# Patient Record
Sex: Male | Born: 1985 | Race: Black or African American | Hispanic: No | Marital: Single
Health system: Southern US, Community
[De-identification: ages and names within clinical notes are randomized; demographics above are authoritative.]

## PROBLEM LIST (undated history)

## (undated) DIAGNOSIS — I10 Essential (primary) hypertension: Secondary | ICD-10-CM

## (undated) HISTORY — DX: Essential (primary) hypertension: I10

---
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
## 2003-09-01 ENCOUNTER — Emergency Department (HOSPITAL_COMMUNITY): Admission: EM | Admit: 2003-09-01 | Discharge: 2003-09-01 | Payer: Self-pay | Admitting: Emergency Medicine

## 2004-05-17 ENCOUNTER — Emergency Department (HOSPITAL_COMMUNITY): Admission: EM | Admit: 2004-05-17 | Discharge: 2004-05-17 | Payer: Self-pay | Admitting: Family Medicine

## 2006-06-21 ENCOUNTER — Emergency Department (HOSPITAL_COMMUNITY): Admission: EM | Admit: 2006-06-21 | Discharge: 2006-06-21 | Payer: Self-pay | Admitting: Emergency Medicine

## 2008-11-26 ENCOUNTER — Emergency Department (HOSPITAL_COMMUNITY): Admission: EM | Admit: 2008-11-26 | Discharge: 2008-11-26 | Payer: Self-pay | Admitting: Emergency Medicine

## 2009-04-19 ENCOUNTER — Emergency Department (HOSPITAL_COMMUNITY): Admission: EM | Admit: 2009-04-19 | Discharge: 2009-04-19 | Payer: Self-pay | Admitting: Family Medicine

## 2011-02-15 ENCOUNTER — Emergency Department (HOSPITAL_COMMUNITY)
Admission: EM | Admit: 2011-02-15 | Discharge: 2011-02-15 | Disposition: A | Discharge status: Home / Self Care | Payer: Medicaid Other | Attending: Emergency Medicine | Admitting: Emergency Medicine

## 2011-02-15 ENCOUNTER — Encounter: Payer: Self-pay | Admitting: Emergency Medicine

## 2011-02-15 ENCOUNTER — Emergency Department (HOSPITAL_COMMUNITY): Payer: Medicaid Other

## 2011-02-15 DIAGNOSIS — M545 Low back pain, unspecified: Secondary | ICD-10-CM | POA: Insufficient documentation

## 2011-02-15 DIAGNOSIS — Z79899 Other long term (current) drug therapy: Secondary | ICD-10-CM | POA: Insufficient documentation

## 2011-02-15 DIAGNOSIS — F17200 Nicotine dependence, unspecified, uncomplicated: Secondary | ICD-10-CM | POA: Insufficient documentation

## 2011-02-15 DIAGNOSIS — S39012A Strain of muscle, fascia and tendon of lower back, initial encounter: Secondary | ICD-10-CM

## 2011-02-15 DIAGNOSIS — X58XXXA Exposure to other specified factors, initial encounter: Secondary | ICD-10-CM | POA: Insufficient documentation

## 2011-02-15 DIAGNOSIS — S335XXA Sprain of ligaments of lumbar spine, initial encounter: Secondary | ICD-10-CM | POA: Insufficient documentation

## 2011-02-15 MED ORDER — HYDROCODONE-ACETAMINOPHEN 5-500 MG PO TABS: 1.0000 | ORAL_TABLET | Freq: Four times a day (QID) | ORAL | Status: AC | PRN

## 2011-02-15 MED ORDER — IBUPROFEN 800 MG PO TABS: 800.0000 mg | ORAL_TABLET | Freq: Three times a day (TID) | ORAL | Status: AC

## 2011-02-15 MED ORDER — KETOROLAC TROMETHAMINE 60 MG/2ML IM SOLN
60.0000 mg | Freq: Once | INTRAMUSCULAR | Status: AC
  Administered 2011-02-15: 60 mg via INTRAMUSCULAR
  Filled 2011-02-15: qty 2

## 2011-02-15 MED ORDER — DIAZEPAM 5 MG PO TABS: 5.0000 mg | ORAL_TABLET | Freq: Three times a day (TID) | ORAL | Status: AC | PRN

## 2011-02-15 MED ORDER — DIAZEPAM 5 MG PO TABS
5.0000 mg | ORAL_TABLET | Freq: Once | ORAL | Status: AC
  Administered 2011-02-15: 5 mg via ORAL
  Filled 2011-02-15: qty 1

## 2011-02-15 MED ORDER — HYDROCODONE-ACETAMINOPHEN 5-325 MG PO TABS
1.0000 | ORAL_TABLET | Freq: Once | ORAL | Status: AC
  Administered 2011-02-15: 1 via ORAL
  Filled 2011-02-15: qty 1

## 2011-02-15 NOTE — ED Provider Notes (Signed)
Medical screening examination/treatment/procedure(s) were performed by non-physician practitioner and as supervising physician I was immediately available for consultation/collaboration.   Jasmine Awe, MD 02/15/11 2355

## 2011-02-15 NOTE — ED Notes (Signed)
Pt c/o pain in lower back after lifting a refrigerator yesterday

## 2011-02-15 NOTE — ED Notes (Signed)
Complaining of lower back pain. States re-injured back moving refrigerator. Pain located in lower mid back with radiation. Requesting xray.
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
## 2012-01-13 ENCOUNTER — Emergency Department (HOSPITAL_COMMUNITY)
Admission: EM | Admit: 2012-01-13 | Discharge: 2012-01-14 | Disposition: A | Discharge status: Home / Self Care | Payer: Self-pay | Attending: Emergency Medicine | Admitting: Emergency Medicine

## 2012-01-13 ENCOUNTER — Encounter (HOSPITAL_COMMUNITY): Payer: Self-pay

## 2012-01-13 ENCOUNTER — Emergency Department (HOSPITAL_COMMUNITY): Payer: Self-pay

## 2012-01-13 DIAGNOSIS — K6289 Other specified diseases of anus and rectum: Secondary | ICD-10-CM | POA: Insufficient documentation

## 2012-01-13 LAB — POCT I-STAT, CHEM 8
BUN: 10 mg/dL (ref 6–23)
Calcium, Ion: 1.17 mmol/L (ref 1.12–1.23)
Chloride: 104 meq/L (ref 96–112)
Creatinine, Ser: 1.2 mg/dL (ref 0.50–1.35)
Glucose, Bld: 90 mg/dL (ref 70–99)
HCT: 45 % (ref 39.0–52.0)
Hemoglobin: 15.3 g/dL (ref 13.0–17.0)
Potassium: 4.1 meq/L (ref 3.5–5.1)
Sodium: 141 meq/L (ref 135–145)
TCO2: 24 mmol/L (ref 0–100)

## 2012-01-13 MED ORDER — HYDROMORPHONE HCL PF 1 MG/ML IJ SOLN
1.0000 mg | Freq: Once | INTRAMUSCULAR | Status: AC
  Administered 2012-01-13: 1 mg via INTRAVENOUS
  Filled 2012-01-13: qty 1

## 2012-01-13 MED ORDER — IOHEXOL 300 MG/ML  SOLN
20.0000 mL | INTRAMUSCULAR | Status: AC
  Administered 2012-01-13: 20 mL via ORAL

## 2012-01-13 MED ORDER — MORPHINE SULFATE 4 MG/ML IJ SOLN
4.0000 mg | Freq: Once | INTRAMUSCULAR | Status: AC
  Administered 2012-01-13: 4 mg via INTRAVENOUS
  Filled 2012-01-13: qty 1

## 2012-01-13 NOTE — ED Provider Notes (Signed)
History     CSN: [id number]  Arrival date & time 01/13/12  2029   None     Chief Complaint  Patient presents with  . Hemorrhoids    (Consider location/radiation/quality/duration/timing/severity/associated sxs/prior treatment) HPI History provided by pt.   Pt presents w/ gradually worsening rectal pain x 4 days.  Believes he has a hemorrhoid.  Pain aggravated by sitting and he won't have a BM because too painful.  Has noticed some pus in his underwear and blood when he wipes his rectum.  No associated fever or GU sx.    History reviewed. No pertinent past medical history.  History reviewed. No pertinent past surgical history.  History reviewed. No pertinent family history.  History  Substance Use Topics  . Smoking status: Current Every Day Smoker    Types: Cigarettes  . Smokeless tobacco: Not on file  . Alcohol Use: No      Review of Systems  All other systems reviewed and are negative.    Allergies  Review of patient's allergies indicates no known allergies.  Home Medications   Current Outpatient Rx  Name Route Sig Dispense Refill  . ACETAMINOPHEN 500 MG PO TABS Oral Take 1,000 mg by mouth every 6 (six) hours as needed. For pain      BP 139/79  Pulse 62  Temp 98.3 F (36.8 C) (Oral)  Resp 18  SpO2 98%  Physical Exam  Nursing note and vitals reviewed. Constitutional: He is oriented to person, place, and time. He appears well-developed and well-nourished. No distress.  HENT:  Head: Normocephalic and atraumatic.  Eyes:       Normal appearance  Neck: Normal range of motion.  Cardiovascular: Normal rate and regular rhythm.   Pulmonary/Chest: Effort normal and breath sounds normal. No respiratory distress.  Abdominal: Soft. Bowel sounds are normal. He exhibits no distension and no mass. There is no tenderness. There is no rebound and no guarding.  Genitourinary:       No external hemorrhoids or abscess.  No skin changes.  Nml rectal tone.  Nml stool  color.  Severe tenderness left wall of rectum w/out obvious mass or fistula.  Pt became diaphoretic and tearful during exam.   Musculoskeletal: Normal range of motion.  Neurological: He is alert and oriented to person, place, and time.  Skin: Skin is warm and dry. No rash noted.  Psychiatric: He has a normal mood and affect. His behavior is normal.    ED Course  Procedures (including critical care time)  Labs Reviewed - No data to display No results found.   1. Rectal pain       MDM  26yo M presents w/ rectal pain x 4 days.  Has noticed pus in underwear and blood when he wipes.  Has not attempted to have a BM.  No visible/palpable findings on exam, but left wall of rectum severely tender and pt became both tearful and diaphoretic w/ palpation.  Discussed w/ Dr. Ranae Palms who recommends CT pelvis to r/o abscess.  Pt receiving IV analgesics.  11:25 PM   CT shows left perirectal fullness.  Discussed w/ Dr. Lavella Lemons and she recommends starting clindamycin for possible infectious process and refer to [facility].  Prescribed percocet for pain and recommended docusate sodium as well.  Return precautions discussed.         Otilio Miu, [place] 01/14/12 [phone]

## 2012-01-13 NOTE — ED Notes (Signed)
Patient currently resting quietly in bed; no respiratory or acute distress noted.  Patient updated on plan of care; patient currently pending for CT scan.  CT aware that patient has finished contrast/water cups.  Patient denies any other needs at this time; will continue to monitor.

## 2012-01-13 NOTE — ED Notes (Signed)
Pt finished drinking CT contrast, CT notified.. 

## 2012-01-13 NOTE — ED Notes (Signed)
Pt reports rectal pain x1 week d/t hemorrhoid. Pt denies hx of the same. Pt reports taking OTC Tylenol w/no relief

## 2012-01-14 MED ORDER — OXYCODONE-ACETAMINOPHEN 5-325 MG PO TABS: 1.0000 | ORAL_TABLET | ORAL | Status: DC | PRN

## 2012-01-14 MED ORDER — IOHEXOL 300 MG/ML  SOLN
100.0000 mL | Freq: Once | INTRAMUSCULAR | Status: AC | PRN
  Administered 2012-01-14: 100 mL via INTRAVENOUS

## 2012-01-14 MED ORDER — OXYCODONE-ACETAMINOPHEN 5-325 MG PO TABS
2.0000 | ORAL_TABLET | Freq: Once | ORAL | Status: AC
  Administered 2012-01-14: 2 via ORAL
  Filled 2012-01-14: qty 2

## 2012-01-14 MED ORDER — CLINDAMYCIN HCL 300 MG PO CAPS: 150.0000 mg | ORAL_CAPSULE | Freq: Three times a day (TID) | ORAL | Status: DC

## 2012-01-14 NOTE — ED Notes (Signed)
I gave the patient a cup of ice and a sprite. 

## 2012-01-14 NOTE — ED Notes (Signed)
Patient currently resting quietly in bed; no respiratory or acute distress noted.  Patient updated on plan of care; informed patient that we are currently waiting for PA to look over CT results.  Patient denies any needs at this time; will continue to monitor.

## 2012-01-14 NOTE — ED Notes (Signed)
Patient given copy of discharge paperwork; went over discharge instructions with patient.  Instructed patient to take prescriptions as directed, to not drive or drink while taking narcotic, to finish entire antibiotic prescription completely, and to return to the ED for follow up with general surgery center for new, worsening, or concerning symptoms.

## 2012-01-14 NOTE — ED Provider Notes (Signed)
Medical screening examination/treatment/procedure(s) were performed by non-physician practitioner and as supervising physician I was immediately available for consultation/collaboration.   Tanae Petrosky, MD 01/14/12 2337 

## 2012-01-16 ENCOUNTER — Ambulatory Visit (INDEPENDENT_AMBULATORY_CARE_PROVIDER_SITE_OTHER): Payer: Self-pay | Admitting: Surgery

## 2012-01-16 ENCOUNTER — Encounter (INDEPENDENT_AMBULATORY_CARE_PROVIDER_SITE_OTHER): Payer: Self-pay | Admitting: Surgery

## 2012-01-16 VITALS — BP 126/76 | HR 55 | Temp 98.0°F | Resp 18 | Ht >= 80 in | Wt 160.6 lb

## 2012-01-16 DIAGNOSIS — K6289 Other specified diseases of anus and rectum: Secondary | ICD-10-CM

## 2012-01-16 MED ORDER — AMOXICILLIN-POT CLAVULANATE 875-125 MG PO TABS: 1.0000 | ORAL_TABLET | Freq: Two times a day (BID) | ORAL | Status: AC

## 2012-01-16 MED ORDER — OXYCODONE-ACETAMINOPHEN 5-325 MG PO TABS: 1.0000 | ORAL_TABLET | ORAL | Status: DC | PRN

## 2012-01-16 NOTE — Progress Notes (Signed)
Patient ID: Cesar Williams, male   DOB: 14-Nov-1985, 26 y.o.   MRN: [id number]  Chief Complaint  Patient presents with  . New Evaluation    ?peri anal abscess    HPI Cesar Williams is a 26 y.o. male.  Patient sent at request of Dr.Yelverton do 2 history of perianal and rectal pain for 2 days. He was seen in the emergency room where CT scan showed some vague thickening of the left perirectal tissue without abscess. No fever or chills documented. Pain is sharp in nature. Denies any swelling or drainage from the rectum. No history of trauma. Pain is severe and sharp in nature. HPI  No past medical history on file.  No past surgical history on file.  No family history on file.  Social History History  Substance Use Topics  . Smoking status: Current Every Day Smoker    Types: Cigarettes  . Smokeless tobacco: Not on file  . Alcohol Use: No    No Known Allergies  Current Outpatient Prescriptions  Medication Sig Dispense Refill  . acetaminophen (TYLENOL) 500 MG tablet Take 1,000 mg by mouth every 6 (six) hours as needed. For pain      . oxyCODONE-acetaminophen (PERCOCET/ROXICET) 5-325 MG per tablet Take 1 tablet by mouth every 4 (four) hours as needed for pain.  20 tablet  0  . amoxicillin-clavulanate (AUGMENTIN) 875-125 MG per tablet Take 1 tablet by mouth 2 (two) times daily.  20 tablet  0    Review of Systems Review of Systems  Constitutional: Negative.   HENT: Negative.   Eyes: Negative.   Respiratory: Negative.   Cardiovascular: Negative.   Gastrointestinal: Positive for rectal pain.  Genitourinary: Negative.   Neurological: Negative.   Hematological: Negative.     Blood pressure 126/76, pulse 55, temperature 98 F (36.7 C), temperature source Oral, resp. rate 18, height 6\' 11"  (2.108 m), weight 160 lb 9.6 oz (72.848 kg).  Physical Exam Physical Exam  Constitutional: He is oriented to person, place, and time. He appears well-developed and well-nourished.  HENT:  Head:  Normocephalic and atraumatic.  Eyes: EOM are normal. Pupils are equal, round, and reactive to light.  Abdominal: Soft. Bowel sounds are normal.  Genitourinary: Rectal exam shows tenderness. Rectal exam shows no external hemorrhoid, no mass and anal tone normal.     Musculoskeletal: Normal range of motion.  Neurological: He is alert and oriented to person, place, and time.  Skin: Skin is warm and dry.  Psychiatric: He has a normal mood and affect. His behavior is normal. Judgment and thought content normal.    Data Reviewed  *RADIOLOGY REPORT*  Clinical Data: Rectal pain  CT ABDOMEN AND PELVIS WITH CONTRAST  Technique: Multidetector CT imaging of the abdomen and pelvis was  performed following the standard protocol during bolus  administration of intravenous contrast.  Contrast: OMNIPAQUE IOHEXOL 300 MG/ML SOLN  Comparison: None.  Findings: Lung bases are clear.  Liver, spleen, pancreas, and adrenal glands are within normal  limits.  Gallbladder is unremarkable. No intrahepatic or extrahepatic  ductal dilatation.  Kidneys are within normal limits. No hydronephrosis.  No evidence of bowel obstruction. Normal appendix. Possible mild  left perirectal fullness (series 2/image 81), equivocal.  No drainable perirectal fluid collection/abscess.  No evidence of abdominal aortic aneurysm.  No abdominopelvic ascites.  No suspicious abdominopelvic lymphadenopathy.  Prostate is unremarkable.  Bladder is underdistended.  Visualized osseous structures are within normal limits.  IMPRESSION:  Possible mild left perirectal fullness,  equivocal. Correlate with  physical exam.  Assessment   Proctitis    Plan    Stop  Clindamycin.  Start augmentin.  No abscess on exam of rectum.  Add diltiazem gel for pain control.  Soak in tub.  Return 1 week.  Pain meds as needed.       Malani Lees A. 01/16/2012, 4:12 PM

## 2012-01-16 NOTE — Patient Instructions (Signed)
Proctitis Proctitis is the swelling and soreness (inflammation) of the lining of the rectum. The rectum is at the end of the large intestine and is attached to the anus. The inflammation causes pain and discomfort. It may be short-term (acute) or long-lasting (chronic). CAUSES Inflammation in the rectum can be caused by many things, such as:  Sexually transmitted diseases (STDs).  Infection.  Anal-rectal trauma or injury.  Ulcerative colitis or Crohn's disease.  Radiation therapy directed near the rectum.  Antibiotic therapy. SYMPTOMS  Sudden, uncomfortable, and frequent urge to have a bowel movement.  Anal or rectal pain.  Abdominal cramping or pain.  Sensation that the rectum is full.  Rectal bleeding.  Pus or mucus discharge from anus.  Diarrhea or frequent soft, loose stools. DIAGNOSIS Diagnosis may include the following:  A history and physical exam.  An STD test.  Blood tests.  Stool tests.  Rectal culture.  A procedure to evaluate the anal canal (anoscopy).  Procedures to look at part, or the entire large bowel (sigmoidoscopy, colonoscopy). TREATMENT Treatment of proctitis depends on the cause. Reducing the symptoms of inflammation and eliminating infection are the main goals of treatment. Treatment may include:  Home remedies and lifestyle, such as sitz baths and avoiding food right before bedtime.  Topical ointments, foams, suppositories, or enemas, such as corticosteroids or anti-inflammatories.  Antibiotic or antiviral medicines to treat infection or to control harmful bacteria.  Medicines to control diarrhea, soften stools, and reduce pain.  Medicines to suppress the immune system.  Avoiding the activity that caused rectal trauma.  Nutritional, dietary, or herbal supplements.  Heat or laser therapy for persistent bleeding.  A dilation procedure to enlarge a narrowed rectum.  Surgery, though rare, may be necessary to repair damaged rectal  lining. HOME CARE INSTRUCTIONS Only take medicines that are recommended or approved by your caregiver.Do not take anti-diarrhea medicine without your caregiver's approval. SEEK MEDICAL CARE IF:  You often experience one or more of the symptoms noted above.  You keep experiencing symptoms after treatment.  You have questions or concerns about your symptoms or treatment plan. MAKE SURE YOU:  Understand these instructions.  Will watch your condition.  Will get help right away if you are not doing well or get worse. FOR MORE INFORMATION National Institute of Diabetes and Digestive and Kidney Disease (NIDDK): www.digestive.https://bradley.com/ Document Released: 03/02/2011 Document Revised: 06/05/2011 Document Reviewed: 03/02/2011 [facility] Patient Information 2013 [place], [place].

## 2012-01-23 ENCOUNTER — Encounter (INDEPENDENT_AMBULATORY_CARE_PROVIDER_SITE_OTHER): Payer: Self-pay | Admitting: Surgery

## 2012-02-05 ENCOUNTER — Encounter (INDEPENDENT_AMBULATORY_CARE_PROVIDER_SITE_OTHER): Payer: Self-pay | Admitting: Surgery

## 2013-11-09 ENCOUNTER — Encounter (HOSPITAL_COMMUNITY): Payer: Self-pay | Admitting: Emergency Medicine

## 2013-11-09 DIAGNOSIS — R0789 Other chest pain: Secondary | ICD-10-CM | POA: Insufficient documentation

## 2013-11-09 DIAGNOSIS — F17200 Nicotine dependence, unspecified, uncomplicated: Secondary | ICD-10-CM | POA: Insufficient documentation

## 2013-11-09 DIAGNOSIS — I10 Essential (primary) hypertension: Secondary | ICD-10-CM | POA: Insufficient documentation

## 2013-11-09 DIAGNOSIS — Y9389 Activity, other specified: Secondary | ICD-10-CM | POA: Insufficient documentation

## 2013-11-09 DIAGNOSIS — T6391XA Toxic effect of contact with unspecified venomous animal, accidental (unintentional), initial encounter: Secondary | ICD-10-CM | POA: Insufficient documentation

## 2013-11-09 DIAGNOSIS — T63461A Toxic effect of venom of wasps, accidental (unintentional), initial encounter: Secondary | ICD-10-CM | POA: Insufficient documentation

## 2013-11-09 DIAGNOSIS — Y9289 Other specified places as the place of occurrence of the external cause: Secondary | ICD-10-CM | POA: Insufficient documentation

## 2013-11-09 MED ORDER — OXYCODONE-ACETAMINOPHEN 5-325 MG PO TABS
1.0000 | ORAL_TABLET | Freq: Once | ORAL | Status: AC
  Administered 2013-11-09: 1 via ORAL
  Filled 2013-11-09: qty 1

## 2013-11-09 NOTE — ED Notes (Signed)
Patient here with complaint of right arm swelling starting yesterday around 1600 after bee sting. Additionally states that his throat feels "scratchy", his breathing feels "heavy", and his face feels swollen. Swelling currently extends from hand to mid forearm. Site marked in triage.

## 2013-11-10 ENCOUNTER — Emergency Department (HOSPITAL_COMMUNITY)
Admission: EM | Admit: 2013-11-10 | Discharge: 2013-11-10 | Disposition: A | Discharge status: Home / Self Care | Payer: Self-pay | Attending: Emergency Medicine | Admitting: Emergency Medicine

## 2013-11-10 ENCOUNTER — Encounter (HOSPITAL_COMMUNITY): Payer: Self-pay | Admitting: Emergency Medicine

## 2013-11-10 DIAGNOSIS — T63444A Toxic effect of venom of bees, undetermined, initial encounter: Secondary | ICD-10-CM

## 2013-11-10 HISTORY — DX: Essential (primary) hypertension: I10

## 2013-11-10 MED ORDER — PREDNISONE 50 MG PO TABS: 50.0000 mg | ORAL_TABLET | Freq: Every day | ORAL | Status: DC

## 2013-11-10 MED ORDER — TRAMADOL HCL 50 MG PO TABS: 50.0000 mg | ORAL_TABLET | Freq: Four times a day (QID) | ORAL | Status: DC | PRN

## 2013-11-10 MED ORDER — CEPHALEXIN 500 MG PO CAPS: 500.0000 mg | ORAL_CAPSULE | Freq: Four times a day (QID) | ORAL | Status: DC

## 2013-11-10 MED ORDER — DIPHENHYDRAMINE HCL 25 MG PO CAPS
50.0000 mg | ORAL_CAPSULE | Freq: Once | ORAL | Status: AC
  Administered 2013-11-10: 50 mg via ORAL
  Filled 2013-11-10: qty 2

## 2013-11-10 MED ORDER — DIPHENHYDRAMINE HCL 25 MG PO CAPS: 25.0000 mg | ORAL_CAPSULE | Freq: Four times a day (QID) | ORAL | Status: DC | PRN

## 2013-11-10 MED ORDER — DEXAMETHASONE SODIUM PHOSPHATE 10 MG/ML IJ SOLN
10.0000 mg | Freq: Once | INTRAMUSCULAR | Status: AC
  Administered 2013-11-10: 10 mg via INTRAMUSCULAR
  Filled 2013-11-10: qty 1

## 2013-11-10 NOTE — ED Provider Notes (Signed)
CSN: [id number]     Arrival date & time 11/09/13  2238 History   First MD Initiated Contact with Patient 11/10/13 0113     Chief Complaint  Patient presents with  . Insect Bite  . Cellulitis     (Consider location/radiation/quality/duration/timing/severity/associated sxs/prior Treatment) HPI Comments: PT comes in with cc of rash to the RUE. Pt is right handed. States that he was out fishing, and got bit around 4 pm. Bit by wasp vs. Bee. Pt has immediate swelling, but ocertime, the swelling and redness has increased. No n/v/f/c. Pt had no wheezing, but felt tightness in his chest when he first came. No hx of allergies.  The history is provided by the patient.    Past Medical History  Diagnosis Date  . Hypertension    History reviewed. No pertinent past surgical history. History reviewed. No pertinent family history. History  Substance Use Topics  . Smoking status: Current Every Day Smoker    Types: Cigarettes  . Smokeless tobacco: Not on file  . Alcohol Use: No    Review of Systems  Constitutional: Negative for activity change and appetite change.  Respiratory: Positive for chest tightness. Negative for shortness of breath and wheezing.   Cardiovascular: Negative for chest pain.  Gastrointestinal: Negative for abdominal pain.  Genitourinary: Negative for dysuria.  Skin: Positive for rash.      Allergies  Review of patient's allergies indicates no known allergies.  Home Medications   Prior to Admission medications   Medication Sig Start Date End Date Taking? Authorizing Provider  diphenhydrAMINE (BENADRYL) 25 MG tablet Take 25 mg by mouth every 6 (six) hours as needed for itching.   Yes Historical Provider, MD  cephALEXin (KEFLEX) 500 MG capsule Take 1 capsule (500 mg total) by mouth 4 (four) times daily. 11/10/13   Derwood KaplanAnkit Donyetta Ogletree, MD  diphenhydrAMINE (BENADRYL) 25 mg capsule Take 1 capsule (25 mg total) by mouth every 6 (six) hours as needed for itching. 11/10/13    Derwood KaplanAnkit Najir Roop, MD  predniSONE (DELTASONE) 50 MG tablet Take 1 tablet (50 mg total) by mouth daily. 11/10/13   Derwood KaplanAnkit Ceazia Harb, MD  traMADol (ULTRAM) 50 MG tablet Take 1 tablet (50 mg total) by mouth every 6 (six) hours as needed. 11/10/13   Rashod Gougeon Rhunette CroftNanavati, MD   BP 94/64  Pulse 53  Temp(Src) 97.9 F (36.6 C) (Oral)  Resp 16  Ht 6\' 1"  (1.854 m)  Wt 150 lb (68.04 kg)  BMI 19.79 kg/m2  SpO2 100% Physical Exam  Nursing note and vitals reviewed. Constitutional: He is oriented to person, place, and time. He appears well-developed.  HENT:  Head: Normocephalic and atraumatic.  Eyes: Conjunctivae and EOM are normal. Pupils are equal, round, and reactive to light.  Neck: Normal range of motion. Neck supple.  Cardiovascular: Normal rate and regular rhythm.   Pulmonary/Chest: Effort normal and breath sounds normal.  Abdominal: Soft. Bowel sounds are normal. He exhibits no distension. There is no tenderness. There is no rebound and no guarding.  Musculoskeletal:  Right upper extremity, especially the hand is swollen ,with some erythema that is spreading to the forearm. No crepitus. Passive ROM is intact.  Neurological: He is alert and oriented to person, place, and time.  Skin: Skin is warm. Rash noted.    ED Course  Procedures (including critical care time) Labs Review Labs Reviewed - No data to display  Imaging Review No results found.   EKG Interpretation None      MDM   Final  diagnoses:  Allergic reaction to bee sting, undetermined intent, initial encounter    Pt with localized allergic reaction to the RUE from a bee/wasp bite.  No concerns for nec fascitis, vitals are stable, pt immunocompetent and there is 0 SIRS. Will d.c. Lung exam is clear- no need for epi pen.    Derwood Kaplan, MD 11/10/13 [phone]

## 2013-11-10 NOTE — ED Notes (Signed)
Patient discharged with all personal belongings. 

## 2013-11-10 NOTE — Discharge Instructions (Signed)

## 2013-11-10 NOTE — ED Notes (Signed)
Patient states he was stung by apprx 3-4 bees yesterday and his hand and arm are increasing in swelling. Edema marked at this time.

## 2014-10-16 ENCOUNTER — Emergency Department (HOSPITAL_COMMUNITY)
Admission: EM | Admit: 2014-10-16 | Discharge: 2014-10-16 | Disposition: A | Discharge status: Home / Self Care | Payer: Self-pay | Attending: Emergency Medicine | Admitting: Emergency Medicine

## 2014-10-16 ENCOUNTER — Emergency Department (HOSPITAL_COMMUNITY): Payer: Self-pay

## 2014-10-16 ENCOUNTER — Encounter (HOSPITAL_COMMUNITY): Payer: Self-pay | Admitting: Neurology

## 2014-10-16 DIAGNOSIS — R519 Headache, unspecified: Secondary | ICD-10-CM

## 2014-10-16 DIAGNOSIS — R51 Headache: Secondary | ICD-10-CM

## 2014-10-16 DIAGNOSIS — S3992XA Unspecified injury of lower back, initial encounter: Secondary | ICD-10-CM | POA: Insufficient documentation

## 2014-10-16 DIAGNOSIS — S060X9A Concussion with loss of consciousness of unspecified duration, initial encounter: Secondary | ICD-10-CM | POA: Insufficient documentation

## 2014-10-16 DIAGNOSIS — S29001A Unspecified injury of muscle and tendon of front wall of thorax, initial encounter: Secondary | ICD-10-CM | POA: Insufficient documentation

## 2014-10-16 DIAGNOSIS — Z792 Long term (current) use of antibiotics: Secondary | ICD-10-CM | POA: Insufficient documentation

## 2014-10-16 DIAGNOSIS — S0990XA Unspecified injury of head, initial encounter: Secondary | ICD-10-CM | POA: Insufficient documentation

## 2014-10-16 DIAGNOSIS — I10 Essential (primary) hypertension: Secondary | ICD-10-CM | POA: Insufficient documentation

## 2014-10-16 DIAGNOSIS — S199XXA Unspecified injury of neck, initial encounter: Secondary | ICD-10-CM | POA: Insufficient documentation

## 2014-10-16 DIAGNOSIS — Y998 Other external cause status: Secondary | ICD-10-CM | POA: Insufficient documentation

## 2014-10-16 DIAGNOSIS — Y9289 Other specified places as the place of occurrence of the external cause: Secondary | ICD-10-CM | POA: Insufficient documentation

## 2014-10-16 DIAGNOSIS — R0789 Other chest pain: Secondary | ICD-10-CM

## 2014-10-16 DIAGNOSIS — M542 Cervicalgia: Secondary | ICD-10-CM

## 2014-10-16 DIAGNOSIS — Y9389 Activity, other specified: Secondary | ICD-10-CM | POA: Insufficient documentation

## 2014-10-16 DIAGNOSIS — T148XXA Other injury of unspecified body region, initial encounter: Secondary | ICD-10-CM

## 2014-10-16 DIAGNOSIS — Z720 Tobacco use: Secondary | ICD-10-CM | POA: Insufficient documentation

## 2014-10-16 MED ORDER — HYDROCODONE-ACETAMINOPHEN 5-325 MG PO TABS
1.0000 | ORAL_TABLET | Freq: Once | ORAL | Status: AC
  Administered 2014-10-16: 1 via ORAL
  Filled 2014-10-16: qty 1

## 2014-10-16 MED ORDER — CYCLOBENZAPRINE HCL 10 MG PO TABS: 10.0000 mg | ORAL_TABLET | Freq: Three times a day (TID) | ORAL | Status: DC | PRN

## 2014-10-16 MED ORDER — IBUPROFEN 800 MG PO TABS: 800.0000 mg | ORAL_TABLET | Freq: Three times a day (TID) | ORAL | Status: DC | PRN
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
## 2014-10-16 NOTE — Discharge Instructions (Signed)
Read the information below.  Use the prescribed medication as directed.  Please discuss all new medications with your pharmacist.  You may return to the Emergency Department at any time for worsening condition or any new symptoms that concern you.  If you develop worsening chest pain, shortness of breath, fever, you pass out, or become weak or dizzy, return to the ER for a recheck.     You have had a head injury which does not appear to require admission at this time. A concussion is a state of changed mental ability from trauma. SEEK IMMEDIATE MEDICAL ATTENTION IF: There is confusion or drowsiness (although children frequently become drowsy after injury).  You cannot awaken the injured person.  There is nausea (feeling sick to your stomach) or continued, forceful vomiting.  You notice dizziness or unsteadiness which is getting worse, or inability to walk.  You have convulsions or unconsciousness.  You experience severe, persistent headaches not relieved by Tylenol?. (Do not take aspirin as this impairs clotting abilities). Take other pain medications only as directed.  You cannot use arms or legs normally.  There are changes in pupil sizes. (This is the black center in the colored part of the eye)  There is clear or bloody discharge from the nose or ears.  Change in speech, vision, swallowing, or understanding.  Localized weakness, numbness, tingling, or change in bowel or bladder control.   Abrasions An abrasion is a cut or scrape of the skin. Abrasions do not go through all layers of the skin. HOME CARE  If a bandage (dressing) was put on your wound, change it as told by your doctor. If the bandage sticks, soak it off with warm.  Wash the area with water and soap 2 times a day. Rinse off the soap. Pat the area dry with a clean towel.  Put on medicated cream (ointment) as told by your doctor.  Change your bandage right away if it gets wet or dirty.  Only take medicine as told by your  doctor.  See your doctor within 24-48 hours to get your wound checked.  Check your wound for redness, puffiness (swelling), or yellowish-white fluid (pus). GET HELP RIGHT AWAY IF:   You have more pain in the wound.  You have redness, swelling, or tenderness around the wound.  You have pus coming from the wound.  You have a fever or lasting symptoms for more than 2-3 days.  You have a fever and your symptoms suddenly get worse.  You have a bad smell coming from the wound or bandage. MAKE SURE YOU:   Understand these instructions.  Will watch your condition.  Will get help right away if you are not doing well or get worse. Document Released: 08/30/2007 Document Revised: 12/06/2011 Document Reviewed: 02/14/2011 [facility] Patient Information 2015 [place], [place]. This information is not intended to replace advice given to you by your health care provider. Make sure you discuss any questions you have with your health care provider.  Chest Wall Pain Chest wall pain is pain felt in or around the chest bones and muscles. It may take up to 6 weeks to get better. It may take longer if you are active. Chest wall pain can happen on its own. Other times, things like germs, injury, coughing, or exercise can cause the pain. HOME CARE   Avoid activities that make you tired or cause pain. Try not to use your chest, belly (abdominal), or side muscles. Do not use heavy weights.  Put  ice on the sore area.  Put ice in a plastic bag.  Place a towel between your skin and the bag.  Leave the ice on for 15-20 minutes for the first 2 days.  Only take medicine as told by your doctor. GET HELP RIGHT AWAY IF:   You have more pain or are very uncomfortable.  You have a fever.  Your chest pain gets worse.  You have new problems.  You feel sick to your stomach (nauseous) or throw up (vomit).  You start to sweat or feel lightheaded.  You have a cough with mucus (phlegm).  You cough up  blood. MAKE SURE YOU:   Understand these instructions.  Will watch your condition.  Will get help right away if you are not doing well or get worse. Document Released: 08/30/2007 Document Revised: 06/05/2011 Document Reviewed: 11/07/2010 [facility] Patient Information 2015 [place], [place]. This information is not intended to replace advice given to you by your health care provider. Make sure you discuss any questions you have with your health care provider.  Concussion A concussion is a brain injury. It is caused by:  A hit to the head.  A quick and sudden movement (jolt) of the head or neck. A concussion is usually not life threatening. Even so, it can cause serious problems. If you had a concussion before, you may have concussion-like problems after a hit to your head. HOME CARE General Instructions  Follow your doctor's directions carefully.  Take medicines only as told by your doctor.  Only take medicines your doctor says are safe.  Do not drink alcohol until your doctor says it is okay. Alcohol and some drugs can slow down healing. They can also put you at risk for further injury.  If you are having trouble remembering things, write them down.  Try to do one thing at a time if you get distracted easily. For example, do not watch TV while making dinner.  Talk to your family members or close friends when making important decisions.  Follow up with your doctor as told.  Watch your symptoms. Tell others to do the same. Serious problems can sometimes happen after a concussion. Older adults are more likely to have these problems.  Tell your teachers, school nurse, school counselor, coach, Event organiser, or work Production designer, theatre/television/film about your concussion. Tell them about what you can or cannot do. They should watch to see if:  It gets even harder for you to pay attention or concentrate.  It gets even harder for you to remember things or learn new things.  You need more time than normal  to finish things.  You become annoyed (irritable) more than before.  You are not able to deal with stress as well.  You have more problems than before.  Rest. Make sure you:  Get plenty of sleep at night.  Go to sleep early.  Go to bed at the same time every day. Try to wake up at the same time.  Rest during the day.  Take naps when you feel tired.  Limit activities where you have to think a lot or concentrate. These include:  Doing homework.  Doing work related to a job.  Watching TV.  Using the computer. Returning To Your Regular Activities Return to your normal activities slowly, not all at once. You must give your body and brain enough time to heal.   Do not play sports or do other athletic activities until your doctor says it is okay.  Ask your doctor when you can drive, ride a bicycle, or work other vehicles or machines. Never do these things if you feel dizzy.  Ask your doctor about when you can return to work or school. Preventing Another Concussion It is very important to avoid another brain injury, especially before you have healed. In rare cases, another injury can lead to permanent brain damage, brain swelling, or death. The risk of this is greatest during the first 7-10 days after your injury. Avoid injuries by:   Wearing a seat belt when riding in a car.  Not drinking too much alcohol.  Avoiding activities that could lead to a second concussion (such as contact sports).  Wearing a helmet when doing activities like:  Biking.  Skiing.  Skateboarding.  Skating.  Making your home safer by:  Removing things from the floor or stairways that could make you trip.  Using grab bars in bathrooms and handrails by stairs.  Placing non-slip mats on floors and in bathtubs.  Improve lighting in dark areas. GET HELP IF:  It gets even harder for you to pay attention or concentrate.  It gets even harder for you to remember things or learn new  things.  You need more time than normal to finish things.  You become annoyed (irritable) more than before.  You are not able to deal with stress as well.  You have more problems than before.  You have problems keeping your balance.  You are not able to react quickly when you should. Get help if you have any of these problems for more than 2 weeks:   Lasting (chronic) headaches.  Dizziness or trouble balancing.  Feeling sick to your stomach (nausea).  Seeing (vision) problems.  Being affected by noises or light more than normal.  Feeling sad, low, down in the dumps, blue, gloomy, or empty (depressed).  Mood changes (mood swings).  Feeling of fear or nervousness about what may happen (anxiety).  Feeling annoyed.  Memory problems.  Problems concentrating or paying attention.  Sleep problems.  Feeling tired all the time. GET HELP RIGHT AWAY IF:   You have bad headaches or your headaches get worse.  You have weakness (even if it is in one hand, leg, or part of the face).  You have loss of feeling (numbness).  You feel off balance.  You keep throwing up (vomiting).  You feel tired.  One black center of your eye (pupil) is larger than the other.  You twitch or shake violently (convulse).  Your speech is not clear (slurred).  You are more confused, easily angered (agitated), or annoyed than before.  You have more trouble resting than before.  You are unable to recognize people or places.  You have neck pain.  It is difficult to wake you up.  You have unusual behavior changes.  You pass out (lose consciousness). MAKE SURE YOU:   Understand these instructions.  Will watch your condition.  Will get help right away if you are not doing well or get worse. Document Released: 03/01/2009 Document Revised: 07/28/2013 Document Reviewed: 10/03/2012 [facility] Patient Information 2015 [place], [place]. This information is not intended to replace advice given  to you by your health care provider. Make sure you discuss any questions you have with your health care provider.  Head Injury You have a head injury. Headaches and throwing up (vomiting) are common after a head injury. It should be easy to wake up from sleeping. Sometimes you must stay in the hospital.  Most problems happen within the first 24 hours. Side effects may occur up to 7-10 days after the injury.  WHAT ARE THE TYPES OF HEAD INJURIES? Head injuries can be as minor as a bump. Some head injuries can be more severe. More severe head injuries include:  A jarring injury to the brain (concussion).  A bruise of the brain (contusion). This mean there is bleeding in the brain that can cause swelling.  A cracked skull (skull fracture).  Bleeding in the brain that collects, clots, and forms a bump (hematoma). WHEN SHOULD I GET HELP RIGHT AWAY?   You are confused or sleepy.  You cannot be woken up.  You feel sick to your stomach (nauseous) or keep throwing up (vomiting).  Your dizziness or unsteadiness is getting worse.  You have very bad, lasting headaches that are not helped by medicine. Take medicines only as told by your doctor.  You cannot use your arms or legs like normal.  You cannot walk.  You notice changes in the black spots in the center of the colored part of your eye (pupil).  You have clear or bloody fluid coming from your nose or ears.  You have trouble seeing. During the next 24 hours after the injury, you must stay with someone who can watch you. This person should get help right away (call 911 in the [place] if you start to shake and are not able to control it (have seizures), you pass out, or you are unable to wake up. HOW CAN I PREVENT A HEAD INJURY IN THE FUTURE?  Wear seat belts.  Wear a helmet while bike riding and playing sports like football.  Stay away from dangerous activities around the house. WHEN CAN I RETURN TO NORMAL ACTIVITIES AND ATHLETICS? See  your doctor before doing these activities. You should not do normal activities or play contact sports until 1 week after the following symptoms have stopped:  Headache that does not go away.  Dizziness.  Poor attention.  Confusion.  Memory problems.  Sickness to your stomach or throwing up.  Tiredness.  Fussiness.  Bothered by bright lights or loud noises.  Anxiousness or depression.  Restless sleep. MAKE SURE YOU:   Understand these instructions.  Will watch your condition.  Will get help right away if you are not doing well or get worse. Document Released: 02/24/2008 Document Revised: 07/28/2013 Document Reviewed: 11/18/2012 [facility] Patient Information 2015 [place], [place]. This information is not intended to replace advice given to you by your health care provider. Make sure you discuss any questions you have with your health care provider.    Emergency Department Resource Guide 1) Find a Doctor and Pay Out of Pocket Although you won't have to find out who is covered by your insurance plan, it is a good idea to ask around and get recommendations. You will then need to call the office and see if the doctor you have chosen will accept you as a new patient and what types of options they offer for patients who are self-pay. Some doctors offer discounts or will set up payment plans for their patients who do not have insurance, but you will need to ask so you aren't surprised when you get to your appointment.  2) Contact Your Local Health Department Not all health departments have doctors that can see patients for sick visits, but many do, so it is worth a call to see if yours does. If you don't know where your local health department  is, you can check in your phone book. The CDC also has a tool to help you locate your state's health department, and many state websites also have listings of all of their local health departments.  3) Find a Walk-in Clinic If your illness is  not likely to be very severe or complicated, you may want to try a walk in clinic. These are popping up all over the country in pharmacies, drugstores, and shopping centers. They're usually staffed by nurse practitioners or physician assistants that have been trained to treat common illnesses and complaints. They're usually fairly quick and inexpensive. However, if you have serious medical issues or chronic medical problems, these are probably not your best option.  No Primary Care Doctor: - Call Health Connect at  [phone] - they can help you locate a primary care doctor that  accepts your insurance, provides certain services, etc. - Physician Referral Service- [phone]  Chronic Pain Problems: Organization         Address  Phone   Notes  [facility]  [phone] Patients need to be referred by their primary care doctor.   Medication Assistance: Organization         Address  Phone   Notes  [facility] Medication [facility] [address] [place] [place], [place] [address] [phone] --Must be a resident of [facility] -- Must have NO insurance coverage whatsoever (no Medicaid/ Medicare, etc.) -- The pt. MUST have a primary care doctor that directs their care regularly and follows them in the community   MedAssist  [phone]   [employer]  [phone]    Agencies that provide inexpensive medical care: Organization         Address  Phone   Notes  [facility]  [phone]   [facility]    [phone]   [facility] [address] [place], [place] [address] [phone]   [facility] [address], [place] [phone]   Planned Parenthood    [phone]   [facility]    [phone]   [facility] and [facility]  [address], [place] Phone:  [phone], Fax:  [phone] Hours of Operation:  9 am - 6  pm, M-F.  Also accepts Medicaid/Medicare and self-pay.  [facility]  [address], [place] Phone: [phone], Fax: [phone]. Hours of Operation:  8:30 am - 5:30 pm, M-F.  Also accepts Medicaid and self-pay.  [facility] [place] [address] [place] Phone: [phone]   [facility] [address] Natasha Bence [place], [place] [phone], Ext. [phone] Mondays & Thursdays: 7-9 AM.  First 15 patients are seen on a first come, first serve basis.    Medicaid-accepting [facility] Providers:  Organization         Address  Phone   Notes  [facility] [address], [place] [phone] Also accepts self-pay patients.  [facility] [address] Laurell Josephs [place], [place]  [phone]   [facility] [address], [place] [phone]   [facility] [address] [place] [phone]   Renaye Rakers [address], [place]   [phone] Only accepts [place] patients after they have their  name applied to their card.   Self-Pay (no insurance) in [facility]:  Organization         Address  Phone   Notes  Sickle Cell Patients, [facility] [address] [place], [place] [phone]   [facility] [address] [place], [place] [phone]   [facility] [place]  [address], [place] [phone]   [facility]/Dr. Osei-Bonsu  [address] [place] or [address], [place] [phone] Phone number for both [place] and [place] locations is the same.  [facility] [address], [place] [phone]   [facility] [address] [place] or [address] [phone]   [facility] [address] [place] [phone], phone; [phone], fax Sees patients 1st and 3rd Saturday of every month.  Must not qualify for public or private insurance (i.e. Medicaid, Medicare, [place] Health Choice, Veterans' Benefits)  Household income should be no more than 200% of the poverty level The clinic cannot treat you if you are pregnant or think you are pregnant  Sexually transmitted diseases are not treated at the clinic.    Dental Care: Organization         Address  Phone  Notes  [facility] Department of [facility] [address] [place], [place] [phone] Accepts children up to age 64 who are enrolled in [place] or [place] Health Choice; pregnant women with a Medicaid card; and children who have applied for Medicaid or [place] Health Choice, but were declined, whose parents can pay a reduced fee at time of service.  [facility]  [address], [place] [phone] Accepts children up to age 30 who are enrolled in [place] or [place] Health Choice; pregnant women with a Medicaid card; and children who have applied for Medicaid or [place] Health Choice, but were declined, whose parents can pay a reduced fee at time of service.  [facility]  [address] [place], [place] [phone] Patients are seen by appointment only. Walk-ins are not accepted. [facility] will see patients 37 years of age and older. Monday - Tuesday (8am-5pm) Most Wednesdays (8:30-5pm) $30 per visit, cash only  [facility] Adult [facility]  [address], [facility] [phone] Patients are seen by appointment only. Walk-ins are not accepted. [facility] will see patients 106 years of age and older. One Wednesday Evening (Monthly: Volunteer Based).  $30 per visit, cash only  [employer]  [phone] for adults; Children under age 65, call Graduate Pediatric Dentistry at [phone]. Children aged 32-14, please call [phone] to request a pediatric application.  Dental services are provided in all areas of dental care including fillings, crowns and bridges, complete and partial dentures, implants, gum treatment, root canals, and extractions. Preventive care is also provided. Treatment is provided to both adults and children. Patients are selected via a lottery and there is often a waiting list.   [facility] [address] [place]  [phone] [email or web address]   [facility] [address] [place], [place] [phone], Ext. [phone] Second and Fourth Thursday of each month, opens at 6:30 AM; Clinic ends at 9 AM.  Patients are seen on a  first-come first-served basis, and a limited number are seen during each clinic.   [facility]  [address] Ether Griffins [place], [place] [phone]   Eligibility Requirements You must have lived in [place], [place], or [place] counties for at least the last three months.   You cannot be eligible for state or federal sponsored [place], including [employer], [place], or [employer].   You generally cannot be eligible for healthcare insurance through your employer.    How to apply: Eligibility screenings are held every Tuesday and Wednesday afternoon from 1:00 pm until 4:00 pm. You do not need an appointment for the interview!  [employer] [address], [place], [place] [phone]   [facility] Health Department  [phone]   [facility] Health Department  [phone]   [facility] Health Department  [phone]    Behavioral Health Resources in the Community: Intensive Outpatient Programs Organization         Address  Phone  Notes  [facility] [address] [place], [place] [phone]   [facility] [address], [place], [place] [phone]   [facility]  [address], [place], [place]  [phone]   [facility] [address]  [place], [place] [phone] or [phone]   Substance Abuse Resources Organization         Address  Phone  Notes  [facility]  [phone]   [facility]  [phone]   [facility] [place]  [phone]   Floydene Flock  [phone]   Residential & Outpatient Substance Abuse Program  [phone]   Psychological Services Organization         Address  Phone  Notes  [facility]  [phone]   [facility]  [phone]   [facility] [address] [place] [phone] or [phone]    Mobile Crisis Teams Organization         Address  Phone  Notes  [facility], Mobile Crisis Care Unit  [phone]   [facility]  [address] [place], [place] [phone]   Doristine Locks [address] [place] [phone]    Self-Help/Support Groups Organization         Address  Phone             Notes  [facility] [place] - variety of support groups  [phone]- [phone] Call for more information  Narcotics Anonymous (NA), [facility] [address], [employer] [place]  2 meetings at this location   Statistician         Address  Phone  Notes  [facility] [address],    [place]  [phone]   [facility]  [address] [place] [address], [place], [place] [phone]   [facility] [address] [place], [place] [phone] Admissions: 8am-3pm M-F  [facility] [address],    [place], [place] [phone]   [facility] [address] [place] [address] Starling Manns [place], [place] [phone]   [facility] [address]  [place], [place] [phone]   [facility] - Intensive Outpatient [address] Laurell Josephs [address], [place], [place]  [phone]   [facility] ([facility] [address] [place] [address] [place]  [place], [place] [phone] or [phone]   [facility] [address], [place], [place] [phone]  Accepts Medicaid  [facility] [address],  [place] [phone] Substance Abuse/Addiction Treatment   [employer]         Address  Phone  Notes  [facility]  [phone]   Angie Fava, PhD [address] Kn[address] [place], [place]   [phone] or [phone]   [facility]   [address] [place], [place] [phone]   [facility] [address], [place], [place] [phone] Insurance/Medicaid/sponsorship through [facility] [address]                                    [place], [place] [phone] Therapy/tele-psych/case  [facility] [address], [place] [phone]    Dr. Lolly Mustache  [phone]   [facility]  [employer] [facility] Dept[facility] 1) [address], [place] 2) [address] [place] 3)  [address], [place] [phone]  [phone]   [facility] Child Abuse Hotline [phone] or [phone] (After Hours)

## 2014-10-20 ENCOUNTER — Emergency Department (HOSPITAL_COMMUNITY)
Admission: EM | Admit: 2014-10-20 | Discharge: 2014-10-20 | Disposition: A | Discharge status: Home / Self Care | Payer: Self-pay | Attending: Emergency Medicine | Admitting: Emergency Medicine

## 2014-10-20 ENCOUNTER — Encounter (HOSPITAL_COMMUNITY): Payer: Self-pay | Admitting: Emergency Medicine

## 2014-10-20 DIAGNOSIS — H53149 Visual discomfort, unspecified: Secondary | ICD-10-CM | POA: Insufficient documentation

## 2014-10-20 DIAGNOSIS — Z79899 Other long term (current) drug therapy: Secondary | ICD-10-CM | POA: Insufficient documentation

## 2014-10-20 DIAGNOSIS — M25511 Pain in right shoulder: Secondary | ICD-10-CM | POA: Insufficient documentation

## 2014-10-20 DIAGNOSIS — F0781 Postconcussional syndrome: Secondary | ICD-10-CM | POA: Insufficient documentation

## 2014-10-20 DIAGNOSIS — R112 Nausea with vomiting, unspecified: Secondary | ICD-10-CM | POA: Insufficient documentation

## 2014-10-20 DIAGNOSIS — R531 Weakness: Secondary | ICD-10-CM | POA: Insufficient documentation

## 2014-10-20 DIAGNOSIS — I10 Essential (primary) hypertension: Secondary | ICD-10-CM | POA: Insufficient documentation

## 2014-10-20 DIAGNOSIS — Z720 Tobacco use: Secondary | ICD-10-CM | POA: Insufficient documentation

## 2014-10-20 DIAGNOSIS — G44309 Post-traumatic headache, unspecified, not intractable: Secondary | ICD-10-CM | POA: Insufficient documentation

## 2014-10-20 DIAGNOSIS — M545 Low back pain: Secondary | ICD-10-CM | POA: Insufficient documentation

## 2014-10-20 MED ORDER — PROMETHAZINE HCL 25 MG PO TABS: 25.0000 mg | ORAL_TABLET | Freq: Four times a day (QID) | ORAL | Status: DC | PRN

## 2014-10-20 MED ORDER — ACETAMINOPHEN 325 MG PO TABS
650.0000 mg | ORAL_TABLET | Freq: Once | ORAL | Status: AC
  Administered 2014-10-20: 650 mg via ORAL
  Filled 2014-10-20: qty 2

## 2014-10-20 NOTE — Discharge Instructions (Signed)
Post-Concussion Syndrome Post-concussion syndrome describes the symptoms that can occur after a head injury. These symptoms can last from weeks to months. CAUSES  It is not clear why some head injuries cause post-concussion syndrome. It can occur whether your head injury was mild or severe and whether you were wearing head protection or not.  SIGNS AND SYMPTOMS  Memory difficulties.  Dizziness.  Headaches.  Double vision or blurry vision.  Sensitivity to light.  Hearing difficulties.  Depression.  Tiredness.  Weakness.  Difficulty with concentration.  Difficulty sleeping or staying asleep.  Vomiting.  Poor balance or instability on your feet.  Slow reaction time.  Difficulty learning and remembering things you have heard. DIAGNOSIS  There is no test to determine whether you have post-concussion syndrome. Your health care provider may order an imaging scan of your brain, such as a CT scan, to check for other problems that may be causing your symptoms (such as severe injury inside your skull). TREATMENT  Usually, these problems disappear over time without medical care. Your health care provider may prescribe medicine to help ease your symptoms. It is important to follow up with a neurologist to evaluate your recovery and address any lingering symptoms or issues. HOME CARE INSTRUCTIONS   Only take over-the-counter or prescription medicines for pain, discomfort, or fever as directed by your health care provider. Do not take aspirin. Aspirin can slow blood clotting.  Sleep with your head slightly elevated to help with headaches.  Avoid any situation where there is potential for another head injury (football, hockey, soccer, basketball, martial arts, downhill snow sports, and horseback riding). Your condition will get worse every time you experience a concussion. You should avoid these activities until you are evaluated by the appropriate follow-up health care  providers.  Keep all follow-up appointments as directed by your health care provider. SEEK IMMEDIATE MEDICAL CARE IF:  You develop confusion or unusual drowsiness.  You cannot wake the injured person.  You develop nausea or persistent, forceful vomiting.  You feel like you are moving when you are not (vertigo).  You notice the injured person's eyes moving rapidly back and forth. This may be a sign of vertigo.  You have convulsions or faint.  You have severe, persistent headaches that are not relieved by medicine.  You cannot use your arms or legs normally.  Your pupils change size.  You have clear or bloody discharge from the nose or ears.  Your problems are getting worse, not better. MAKE SURE YOU:  Understand these instructions.  Will watch your condition.  Will get help right away if you are not doing well or get worse. Document Released: 09/02/2001 Document Revised: 01/01/2013 Document Reviewed: 06/18/2013 ExitCare Patient Information 2015 [employer]. This information is not intended to replace advice given to you by your health care provider. Make sure you discuss any questions you have with your health care provider.  

## 2014-10-20 NOTE — ED Notes (Signed)
PT. STATED, i WAS HERE ON Thursday FOR THE SAME THING AND iM NO BETTER. HEADACHE AND BACK PAIN.  I ALSO FEEL WEAK.
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
## 2015-07-07 ENCOUNTER — Encounter (HOSPITAL_COMMUNITY): Payer: Self-pay | Admitting: *Deleted

## 2015-07-07 ENCOUNTER — Emergency Department (HOSPITAL_COMMUNITY)
Admission: EM | Admit: 2015-07-07 | Discharge: 2015-07-07 | Disposition: A | Discharge status: Home / Self Care | Payer: Self-pay | Attending: Emergency Medicine | Admitting: Emergency Medicine

## 2015-07-07 DIAGNOSIS — M25511 Pain in right shoulder: Secondary | ICD-10-CM | POA: Insufficient documentation

## 2015-07-07 DIAGNOSIS — M791 Myalgia, unspecified site: Secondary | ICD-10-CM

## 2015-07-07 DIAGNOSIS — R51 Headache: Secondary | ICD-10-CM | POA: Insufficient documentation

## 2015-07-07 DIAGNOSIS — M542 Cervicalgia: Secondary | ICD-10-CM | POA: Insufficient documentation

## 2015-07-07 DIAGNOSIS — Z87828 Personal history of other (healed) physical injury and trauma: Secondary | ICD-10-CM | POA: Insufficient documentation

## 2015-07-07 DIAGNOSIS — M62838 Other muscle spasm: Secondary | ICD-10-CM | POA: Insufficient documentation

## 2015-07-07 DIAGNOSIS — F17210 Nicotine dependence, cigarettes, uncomplicated: Secondary | ICD-10-CM | POA: Insufficient documentation

## 2015-07-07 DIAGNOSIS — M549 Dorsalgia, unspecified: Secondary | ICD-10-CM | POA: Insufficient documentation

## 2015-07-07 DIAGNOSIS — I10 Essential (primary) hypertension: Secondary | ICD-10-CM | POA: Insufficient documentation

## 2015-07-07 MED ORDER — CYCLOBENZAPRINE HCL 10 MG PO TABS: 10.0000 mg | ORAL_TABLET | Freq: Two times a day (BID) | ORAL | Status: DC | PRN

## 2015-07-07 MED ORDER — NAPROXEN 500 MG PO TABS: 500.0000 mg | ORAL_TABLET | Freq: Two times a day (BID) | ORAL | Status: DC
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
## 2015-07-07 NOTE — Discharge Instructions (Signed)
Musculoskeletal Pain Musculoskeletal pain is muscle and boney aches and pains. These pains can occur in any part of the body. Your caregiver may treat you without knowing the cause of the pain. They may treat you if blood or urine tests, X-rays, and other tests were normal.  CAUSES There is often not a definite cause or reason for these pains. These pains may be caused by a type of germ (virus). The discomfort may also come from overuse. Overuse includes working out too hard when your body is not fit. Boney aches also come from weather changes. Bone is sensitive to atmospheric pressure changes. HOME CARE INSTRUCTIONS   Ask when your test results will be ready. Make sure you get your test results.  Only take over-the-counter or prescription medicines for pain, discomfort, or fever as directed by your caregiver. If you were given medications for your condition, do not drive, operate machinery or power tools, or sign legal documents for 24 hours. Do not drink alcohol. Do not take sleeping pills or other medications that may interfere with treatment.  Continue all activities unless the activities cause more pain. When the pain lessens, slowly resume normal activities. Gradually increase the intensity and duration of the activities or exercise.  During periods of severe pain, bed rest may be helpful. Lay or sit in any position that is comfortable.  Putting ice on the injured area.  Put ice in a bag.  Place a towel between your skin and the bag.  Leave the ice on for 15 to 20 minutes, 3 to 4 times a day.  Follow up with your caregiver for continued problems and no reason can be found for the pain. If the pain becomes worse or does not go away, it may be necessary to repeat tests or do additional testing. Your caregiver may need to look further for a possible cause. SEEK IMMEDIATE MEDICAL CARE IF:  You have pain that is getting worse and is not relieved by medications.  You develop chest pain  that is associated with shortness or breath, sweating, feeling sick to your stomach (nauseous), or throw up (vomit).  Your pain becomes localized to the abdomen.  You develop any new symptoms that seem different or that concern you. MAKE SURE YOU:   Understand these instructions.  Will watch your condition.  Will get help right away if you are not doing well or get worse.   This information is not intended to replace advice given to you by your health care provider. Make sure you discuss any questions you have with your health care provider.  You will need to [facility] with [facility] and community wellness to establish primary care. Once you have done this, they will be able to refer you to an orthopedic specialist for further evaluation of your symptoms. Take Flexeril and Naprosyn as needed for muscle pain. Return to the emergency department if you experience severe increase in your pain, blurry vision, vomiting, bowel or bladder incontinence, numbness or tingling in both of your lower extremities.

## 2015-07-07 NOTE — ED Notes (Signed)
Declined W/C at D/C and was escorted to lobby by RN. 
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
## 2016-08-02 ENCOUNTER — Emergency Department (HOSPITAL_COMMUNITY)
Admission: EM | Admit: 2016-08-02 | Discharge: 2016-08-02 | Disposition: A | Discharge status: Home / Self Care | Payer: Medicaid Other | Attending: Emergency Medicine | Admitting: Emergency Medicine

## 2016-08-02 DIAGNOSIS — I10 Essential (primary) hypertension: Secondary | ICD-10-CM | POA: Diagnosis not present

## 2016-08-02 DIAGNOSIS — Y929 Unspecified place or not applicable: Secondary | ICD-10-CM | POA: Diagnosis not present

## 2016-08-02 DIAGNOSIS — S61412A Laceration without foreign body of left hand, initial encounter: Secondary | ICD-10-CM | POA: Insufficient documentation

## 2016-08-02 DIAGNOSIS — W208XXA Other cause of strike by thrown, projected or falling object, initial encounter: Secondary | ICD-10-CM | POA: Diagnosis not present

## 2016-08-02 DIAGNOSIS — Y999 Unspecified external cause status: Secondary | ICD-10-CM | POA: Diagnosis not present

## 2016-08-02 DIAGNOSIS — F17210 Nicotine dependence, cigarettes, uncomplicated: Secondary | ICD-10-CM | POA: Insufficient documentation

## 2016-08-02 DIAGNOSIS — S6992XA Unspecified injury of left wrist, hand and finger(s), initial encounter: Secondary | ICD-10-CM | POA: Diagnosis present

## 2016-08-02 DIAGNOSIS — Z79899 Other long term (current) drug therapy: Secondary | ICD-10-CM | POA: Diagnosis not present

## 2016-08-02 DIAGNOSIS — Y939 Activity, unspecified: Secondary | ICD-10-CM | POA: Diagnosis not present

## 2016-08-02 MED ORDER — KETOROLAC TROMETHAMINE 30 MG/ML IJ SOLN
15.0000 mg | Freq: Once | INTRAMUSCULAR | Status: AC
  Administered 2016-08-02: 15 mg via INTRAVENOUS
  Filled 2016-08-02: qty 1
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
## 2016-08-02 NOTE — ED Provider Notes (Signed)
[facility]-EMERGENCY DEPT Provider Note   CSN: [id number] Arrival date & time: 08/02/16  1111  By signing my name below, I, Deland Pretty, attest that this documentation has been prepared under the direction and in the presence of Gerhard Munch, MD. Electronically Signed: Deland Pretty, ED Scribe. 08/02/16. 12:18 PM.    History   Chief Complaint Chief Complaint  Patient presents with  . Hand Injury   The history is provided by the patient and medical records. No language interpreter was used.    HPI Comments:  Cesar Williams is a 31 y.o. male who presents to the Emergency Department complaining of a moderately painful laceration to the dorsum of the left hand s/p injury that occurred an hour ago. Pt states a heavy metal gas tank fell onto his left arm and caused his wound. He denies head injury or LOC. Pt has associated symptoms of left forearm pain, weakness and "tingling" in the left fingertips. He has no known drug allergies, and his last tetanus shot was approximately 2 years ago. Pt denies nausea, any other injuries at this time.  Past Medical History:  Diagnosis Date  . Hypertension    There are no active problems to display for this patient.  No past surgical history on file.  Home Medications    Prior to Admission medications   Medication Sig Start Date End Date Taking? Authorizing Provider  cephALEXin (KEFLEX) 500 MG capsule Take 1 capsule (500 mg total) by mouth 4 (four) times daily. Patient not taking: Reported on 10/16/2014 11/10/13   Derwood Kaplan, MD  cyclobenzaprine (FLEXERIL) 10 MG tablet Take 1 tablet (10 mg total) by mouth 3 (three) times daily as needed for muscle spasms (or pain). 10/16/14   Trixie Dredge, PA-C  cyclobenzaprine (FLEXERIL) 10 MG tablet Take 1 tablet (10 mg total) by mouth 2 (two) times daily as needed for muscle spasms. 07/07/15   Dowless, Lelon Mast Tripp, PA-C  diphenhydrAMINE (BENADRYL) 25 mg capsule Take 1 capsule (25 mg total) by mouth every 6  (six) hours as needed for itching. Patient not taking: Reported on 10/16/2014 11/10/13   Derwood Kaplan, MD  ibuprofen (ADVIL,MOTRIN) 800 MG tablet Take 1 tablet (800 mg total) by mouth every 8 (eight) hours as needed for mild pain or moderate pain. 10/16/14   Trixie Dredge, PA-C  naproxen (NAPROSYN) 500 MG tablet Take 1 tablet (500 mg total) by mouth 2 (two) times daily. 07/07/15   Dowless, Lelon Mast Tripp, PA-C  predniSONE (DELTASONE) 50 MG tablet Take 1 tablet (50 mg total) by mouth daily. Patient not taking: Reported on 10/16/2014 11/10/13   Derwood Kaplan, MD  promethazine (PHENERGAN) 25 MG tablet Take 1 tablet (25 mg total) by mouth every 6 (six) hours as needed for nausea. 10/20/14   Fayrene Helper, PA-C  traMADol (ULTRAM) 50 MG tablet Take 1 tablet (50 mg total) by mouth every 6 (six) hours as needed. Patient not taking: Reported on 10/16/2014 11/10/13   Derwood Kaplan, MD   Family History No family history on file.  Social History Social History  Substance Use Topics  . Smoking status: Current Every Day Smoker    Types: Cigarettes  . Smokeless tobacco: Not on file  . Alcohol use No   Allergies   Patient has no known allergies.   Review of Systems Review of Systems  Constitutional: Negative for fever.  Musculoskeletal:       Negative aside from HPI  Skin: Positive for wound.       Negative aside  from HPI  Allergic/Immunologic: Negative for immunocompromised state.  Neurological: Positive for numbness.     Physical Exam Updated Vital Signs BP (!) 130/99 (BP Location: Right Arm)   Pulse 70   Temp 98 F (36.7 C) (Oral)   Resp 17   SpO2 100%   Physical Exam  Constitutional: He is oriented to person, place, and time. He appears well-developed. No distress.  HENT:  Head: Normocephalic and atraumatic.  Eyes: Conjunctivae and EOM are normal.  Cardiovascular: Normal rate, regular rhythm and intact distal pulses.   Pulmonary/Chest: Effort normal. No stridor. No respiratory  distress.  Abdominal: He exhibits no distension.  Musculoskeletal: He exhibits no edema.  Patient describes pain in the elbow, wrist, hand, digits, but has essentially unremarkable exam aside from laceration, and mild swelling about the dorsum of the hand itself. Patient is appropriate grip strength, range of motion, sensation, flexion, extension of the aforementioned digits, joints.  Neurological: He is alert and oriented to person, place, and time.  Skin: Skin is warm and dry.  Dorsum of L hand w 3cm lesion, roughly linear, can be approximated.  Psychiatric: He has a normal mood and affect.  Nursing note and vitals reviewed.  ED Treatments / Results  DIAGNOSTIC STUDIES:  Oxygen Saturation is 100% on RA, normal by my interpretation.    COORDINATION OF CARE:  12:00 PM Discussed treatment plan with pt including wound cleaning at bedside and pt agreed to plan.  Subsequently the patient had Betadine solution bath, cleaning of the wound.   Procedures .Marland Kitchen.Laceration Repair Date/Time: 08/02/2016 12:22 PM Performed by: Gerhard MunchLOCKWOOD, Nihira Puello Authorized by: Gerhard MunchLOCKWOOD, Keishawna Carranza   Consent:    Consent obtained:  Verbal   Consent given by:  Patient   Risks discussed:  Infection, pain, poor cosmetic result and poor wound healing   Alternatives discussed:  No treatment Universal protocol:    Procedure explained and questions answered to patient or proxy's satisfaction: yes     Immediately prior to procedure, a time out was called: yes     Patient identity confirmed:  Verbally with patient Anesthesia (see MAR for exact dosages):    Anesthesia method:  None Laceration details:    Location:  Hand   Hand location:  L hand, dorsum   Length (cm):  3 Repair type:    Repair type:  Simple Pre-procedure details:    Preparation:  Patient was prepped and draped in usual sterile fashion Exploration:    Wound exploration: wound explored through full range of motion and entire depth of wound probed and  visualized   Treatment:    Area cleansed with:  Betadine   Amount of cleaning:  Standard   Irrigation solution:  Tap water Skin repair:    Repair method:  Tissue adhesive Approximation:    Approximation:  Close Post-procedure details:    Dressing:  Sterile dressing   Patient tolerance of procedure:  Tolerated well, no immediate complications      (including critical care time)  Medications Ordered in ED Medications - No data to display   Initial Impression / Assessment and Plan / ED Course  I have reviewed the triage vital signs and the nursing notes.  Pertinent labs & imaging results that were available during my care of the patient were reviewed by me and considered in my medical decision making (see chart for details).   12:15 PM  Pt offered XR in the ED but declines at this time.   Patient tolerated laceration repair well, no complications.  After discussion of return precautions, home care instructions, patient was discharged in stable condition. Without other complaints, notable findings, low suspicion for occult fracture, without thorough evaluation, after the patient declined x-ray, the patient was discharged in stable condition.  Final Clinical Impressions(s) / ED Diagnoses   Final diagnoses:  Laceration of left hand without foreign body, initial encounter    I personally performed the services described in this documentation, which was scribed in my presence. The recorded information has been reviewed and is accurate.       Gerhard Munch, MD 08/02/16 1252

## 2016-08-02 NOTE — Discharge Instructions (Signed)
Please be sure to return here for concerning changes in your condition.

## 2016-09-12 ENCOUNTER — Encounter (HOSPITAL_COMMUNITY): Payer: Self-pay | Admitting: Emergency Medicine

## 2016-09-12 DIAGNOSIS — X500XXA Overexertion from strenuous movement or load, initial encounter: Secondary | ICD-10-CM | POA: Diagnosis not present

## 2016-09-12 DIAGNOSIS — Y9301 Activity, walking, marching and hiking: Secondary | ICD-10-CM | POA: Diagnosis not present

## 2016-09-12 DIAGNOSIS — Y999 Unspecified external cause status: Secondary | ICD-10-CM | POA: Diagnosis not present

## 2016-09-12 DIAGNOSIS — I10 Essential (primary) hypertension: Secondary | ICD-10-CM | POA: Diagnosis not present

## 2016-09-12 DIAGNOSIS — Z87891 Personal history of nicotine dependence: Secondary | ICD-10-CM | POA: Diagnosis not present

## 2016-09-12 DIAGNOSIS — Z7982 Long term (current) use of aspirin: Secondary | ICD-10-CM | POA: Diagnosis not present

## 2016-09-12 DIAGNOSIS — M549 Dorsalgia, unspecified: Secondary | ICD-10-CM | POA: Insufficient documentation

## 2016-09-12 DIAGNOSIS — Y929 Unspecified place or not applicable: Secondary | ICD-10-CM | POA: Diagnosis not present

## 2016-09-12 DIAGNOSIS — Z5321 Procedure and treatment not carried out due to patient leaving prior to being seen by health care provider: Secondary | ICD-10-CM | POA: Insufficient documentation

## 2016-09-12 DIAGNOSIS — Z79899 Other long term (current) drug therapy: Secondary | ICD-10-CM | POA: Diagnosis not present

## 2016-09-12 DIAGNOSIS — M545 Low back pain: Secondary | ICD-10-CM | POA: Diagnosis present

## 2016-09-12 DIAGNOSIS — S39012A Strain of muscle, fascia and tendon of lower back, initial encounter: Secondary | ICD-10-CM | POA: Diagnosis not present

## 2016-09-12 LAB — URINALYSIS, ROUTINE W REFLEX MICROSCOPIC
Bilirubin Urine: NEGATIVE
Glucose, UA: NEGATIVE mg/dL
Hgb urine dipstick: NEGATIVE
Ketones, ur: NEGATIVE mg/dL
LEUKOCYTES UA: NEGATIVE
Nitrite: NEGATIVE
PH: 6 (ref 5.0–8.0)
Protein, ur: NEGATIVE mg/dL
Specific Gravity, Urine: 1.001 — ABNORMAL LOW (ref 1.005–1.030)

## 2016-09-12 NOTE — ED Triage Notes (Signed)
Pt reports back pain x 2 weeks, states he is unable to take the pain anymore. Denies initial injury, but slipped off a ladder today. Pt ambulatory to triage room, denies bowel/bladder incontinence, but endorses urinary frequency starting today.

## 2016-09-13 ENCOUNTER — Encounter (HOSPITAL_COMMUNITY): Payer: Self-pay

## 2016-09-13 ENCOUNTER — Emergency Department (HOSPITAL_COMMUNITY)
Admission: EM | Admit: 2016-09-13 | Discharge: 2016-09-13 | Disposition: A | Discharge status: Home / Self Care | Payer: Medicaid Other | Source: Home / Self Care

## 2016-09-13 ENCOUNTER — Emergency Department (HOSPITAL_COMMUNITY)
Admission: EM | Admit: 2016-09-13 | Discharge: 2016-09-13 | Disposition: A | Discharge status: Home / Self Care | Payer: Medicaid Other | Attending: Emergency Medicine | Admitting: Emergency Medicine

## 2016-09-13 DIAGNOSIS — S39012A Strain of muscle, fascia and tendon of lower back, initial encounter: Secondary | ICD-10-CM | POA: Insufficient documentation

## 2016-09-13 DIAGNOSIS — Z7982 Long term (current) use of aspirin: Secondary | ICD-10-CM | POA: Insufficient documentation

## 2016-09-13 DIAGNOSIS — I10 Essential (primary) hypertension: Secondary | ICD-10-CM | POA: Insufficient documentation

## 2016-09-13 DIAGNOSIS — Z79899 Other long term (current) drug therapy: Secondary | ICD-10-CM | POA: Insufficient documentation

## 2016-09-13 DIAGNOSIS — Y929 Unspecified place or not applicable: Secondary | ICD-10-CM | POA: Insufficient documentation

## 2016-09-13 DIAGNOSIS — Y9301 Activity, walking, marching and hiking: Secondary | ICD-10-CM | POA: Insufficient documentation

## 2016-09-13 DIAGNOSIS — Y999 Unspecified external cause status: Secondary | ICD-10-CM | POA: Insufficient documentation

## 2016-09-13 DIAGNOSIS — Z87891 Personal history of nicotine dependence: Secondary | ICD-10-CM | POA: Insufficient documentation

## 2016-09-13 DIAGNOSIS — X500XXA Overexertion from strenuous movement or load, initial encounter: Secondary | ICD-10-CM | POA: Insufficient documentation

## 2016-09-13 MED ORDER — NAPROXEN 500 MG PO TABS: 500.0000 mg | ORAL_TABLET | Freq: Two times a day (BID) | ORAL | 0 refills | Status: DC

## 2016-09-13 MED ORDER — CYCLOBENZAPRINE HCL 10 MG PO TABS: 10.0000 mg | ORAL_TABLET | Freq: Three times a day (TID) | ORAL | 0 refills | Status: DC | PRN

## 2016-09-13 NOTE — ED Provider Notes (Signed)
[facility] Provider Note   CSN: [id number] Arrival date & time: 09/13/16  7829  By signing my name below, I, Cynda Acres, attest that this documentation has been prepared under the direction and in the presence of Fayrene Helper, PA-C. Electronically Signed: Cynda Acres, Scribe. 09/13/16. 11:25 AM.  History   Chief Complaint Chief Complaint  Patient presents with  . Back Pain    HPI Comments: Cesar Williams. is a 31 y.o. male with a history of hypertension, who presents to the Emergency Department complaining of chronic lower back pain, which acutely worsened two weeks ago. Patient is ambulatory in the emergency department. Patient states he has had lower back problems for 15 years. Patient states he missed a few steps and injured his back yesterday. Patient describes his pain as 10/10 throbbing, aching with no radiation. Patient reports taking BC powders and applying icy hot to the area with some relief.  Patient has not seen a specialist for this problem. Patient denies any fever, chills, numbness, weakness, loss of bowel/bladder control, pain radiation down the bilateral legs, dysuria, hematuria, or any additional symptoms.   The history is provided by the patient. No language interpreter was used.    Past Medical History:  Diagnosis Date  . Hypertension     There are no active problems to display for this patient.   History reviewed. No pertinent surgical history.     Home Medications    Prior to Admission medications   Medication Sig Start Date End Date Taking? Authorizing Provider  cephALEXin (KEFLEX) 500 MG capsule Take 1 capsule (500 mg total) by mouth 4 (four) times daily. Patient not taking: Reported on 10/16/2014 11/10/13   Derwood Kaplan, MD  cyclobenzaprine (FLEXERIL) 10 MG tablet Take 1 tablet (10 mg total) by mouth 3 (three) times daily as needed for muscle spasms (or pain). 10/16/14   Trixie Dredge, PA-C  cyclobenzaprine (FLEXERIL) 10 MG tablet Take 1  tablet (10 mg total) by mouth 2 (two) times daily as needed for muscle spasms. 07/07/15   Dowless, Lelon Mast Tripp, PA-C  diphenhydrAMINE (BENADRYL) 25 mg capsule Take 1 capsule (25 mg total) by mouth every 6 (six) hours as needed for itching. Patient not taking: Reported on 10/16/2014 11/10/13   Derwood Kaplan, MD  ibuprofen (ADVIL,MOTRIN) 800 MG tablet Take 1 tablet (800 mg total) by mouth every 8 (eight) hours as needed for mild pain or moderate pain. 10/16/14   Trixie Dredge, PA-C  naproxen (NAPROSYN) 500 MG tablet Take 1 tablet (500 mg total) by mouth 2 (two) times daily. 07/07/15   Dowless, Lelon Mast Tripp, PA-C  predniSONE (DELTASONE) 50 MG tablet Take 1 tablet (50 mg total) by mouth daily. Patient not taking: Reported on 10/16/2014 11/10/13   Derwood Kaplan, MD  promethazine (PHENERGAN) 25 MG tablet Take 1 tablet (25 mg total) by mouth every 6 (six) hours as needed for nausea. 10/20/14   Fayrene Helper, PA-C  traMADol (ULTRAM) 50 MG tablet Take 1 tablet (50 mg total) by mouth every 6 (six) hours as needed. Patient not taking: Reported on 10/16/2014 11/10/13   Derwood Kaplan, MD    Family History No family history on file.  Social History Social History  Substance Use Topics  . Smoking status: Former Smoker    Types: Cigarettes  . Smokeless tobacco: Never Used  . Alcohol use No     Allergies   Patient has no known allergies.   Review of Systems Review of Systems  Constitutional: Negative for chills  and fever.  Genitourinary: Positive for difficulty urinating (urinary retention). Negative for dysuria and hematuria.  Musculoskeletal: Positive for back pain. Negative for gait problem.  Neurological: Negative for weakness and numbness.     Physical Exam Updated Vital Signs BP 96/63 (BP Location: Left Arm)   Pulse 92   Temp 98 F (36.7 C) (Oral)   Resp 16   Ht 6\' 1"  (1.854 m)   Wt 150 lb (68 kg)   SpO2 100%   BMI 19.79 kg/m   Physical Exam  Constitutional: He is oriented to  person, place, and time. He appears well-developed.  HENT:  Head: Normocephalic and atraumatic.  Mouth/Throat: Oropharynx is clear and moist.  Eyes: Conjunctivae and EOM are normal. Pupils are equal, round, and reactive to light.  Neck: Normal range of motion. Neck supple.  Cardiovascular: Normal rate.   Pulmonary/Chest: Effort normal.  Abdominal: Soft. Bowel sounds are normal.  Musculoskeletal: Normal range of motion. He exhibits tenderness. He exhibits no edema or deformity.  Tenderness to the L1-L3 region on palpation. No crepitus or step off noted. No skin changes.  Intact patella deep tendon reflex. Able to ambulate. No CVA tenderness.   Neurological: He is alert and oriented to person, place, and time.  Skin: Skin is warm and dry.  Psychiatric: He has a normal mood and affect.  Nursing note and vitals reviewed.    ED Treatments / Results  DIAGNOSTIC STUDIES: Oxygen Saturation is 100% on RA, normal by my interpretation.    COORDINATION OF CARE: 11:24 AM Discussed treatment plan with pt at bedside and pt agreed to plan, which includes a muscle relaxer and ibuprofen.   Labs (all labs ordered are listed, but only abnormal results are displayed) Labs Reviewed - No data to display  EKG  EKG Interpretation None       Radiology No results found.  Procedures Procedures (including critical care time)  Medications Ordered in ED Medications - No data to display   Initial Impression / Assessment and Plan / ED Course  I have reviewed the triage vital signs and the nursing notes.  Pertinent labs & imaging results that were available during my care of the patient were reviewed by me and considered in my medical decision making (see chart for details).     BP 96/63 (BP Location: Left Arm)   Pulse 92   Temp 98 F (36.7 C) (Oral)   Resp 16   Ht 6\' 1"  (1.854 m)   Wt 68 kg (150 lb)   SpO2 100%   BMI 19.79 kg/m    Final Clinical Impressions(s) / ED Diagnoses   Final  diagnoses:  Strain of lumbar region, initial encounter    New Prescriptions Current Discharge Medication List     I personally performed the services described in this documentation, which was scribed in my presence. The recorded information has been reviewed and is accurate.   Patient with back pain.  No neurological deficits and normal neuro exam.  Patient is ambulatory.  No loss of bowel or bladder control.  No concern for cauda equina.  No fever, night sweats, weight loss, h/o cancer, IVDA, no recent procedure to back. No urinary symptoms suggestive of UTI.  Supportive care and return precaution discussed. Appears safe for discharge at this time. Follow up as indicated in discharge paperwork.      Fayrene Helperran, Roseana Rhine, PA-C 09/13/16 1141    Mancel BaleWentz, Elliott, MD 09/13/16 [phone]

## 2016-09-13 NOTE — ED Triage Notes (Signed)
Pt reports chronic back pain "I have a pinched nerve or something" and reports pain to lower middle back.

## 2016-09-13 NOTE — ED Notes (Signed)
EDP at bedside  

## 2018-06-26 ENCOUNTER — Encounter (HOSPITAL_COMMUNITY): Payer: Self-pay | Admitting: Emergency Medicine

## 2018-06-26 ENCOUNTER — Other Ambulatory Visit: Payer: Self-pay

## 2018-06-26 ENCOUNTER — Observation Stay (HOSPITAL_COMMUNITY)
Admission: EM | Admit: 2018-06-26 | Discharge: 2018-06-27 | Disposition: A | Discharge status: Home / Self Care | Payer: Self-pay | Attending: Internal Medicine | Admitting: Internal Medicine

## 2018-06-26 DIAGNOSIS — Z87891 Personal history of nicotine dependence: Secondary | ICD-10-CM | POA: Insufficient documentation

## 2018-06-26 DIAGNOSIS — N179 Acute kidney failure, unspecified: Secondary | ICD-10-CM | POA: Diagnosis present

## 2018-06-26 DIAGNOSIS — F1110 Opioid abuse, uncomplicated: Secondary | ICD-10-CM | POA: Insufficient documentation

## 2018-06-26 DIAGNOSIS — F1910 Other psychoactive substance abuse, uncomplicated: Secondary | ICD-10-CM

## 2018-06-26 DIAGNOSIS — T402X1A Poisoning by other opioids, accidental (unintentional), initial encounter: Principal | ICD-10-CM | POA: Insufficient documentation

## 2018-06-26 DIAGNOSIS — E860 Dehydration: Secondary | ICD-10-CM | POA: Diagnosis present

## 2018-06-26 DIAGNOSIS — F1410 Cocaine abuse, uncomplicated: Secondary | ICD-10-CM | POA: Insufficient documentation

## 2018-06-26 DIAGNOSIS — Z791 Long term (current) use of non-steroidal anti-inflammatories (NSAID): Secondary | ICD-10-CM | POA: Insufficient documentation

## 2018-06-26 DIAGNOSIS — I10 Essential (primary) hypertension: Secondary | ICD-10-CM | POA: Insufficient documentation

## 2018-06-26 DIAGNOSIS — M549 Dorsalgia, unspecified: Secondary | ICD-10-CM | POA: Insufficient documentation

## 2018-06-26 DIAGNOSIS — Z79899 Other long term (current) drug therapy: Secondary | ICD-10-CM | POA: Insufficient documentation

## 2018-06-26 DIAGNOSIS — G8929 Other chronic pain: Secondary | ICD-10-CM | POA: Insufficient documentation

## 2018-06-26 DIAGNOSIS — T50901A Poisoning by unspecified drugs, medicaments and biological substances, accidental (unintentional), initial encounter: Secondary | ICD-10-CM | POA: Diagnosis present

## 2018-06-26 LAB — ACETAMINOPHEN LEVEL: Acetaminophen (Tylenol), Serum: <10 ug/mL — ABNORMAL LOW (ref 10–30)

## 2018-06-26 LAB — CBC WITH DIFFERENTIAL/PLATELET
Abs Immature Granulocytes: 0.09 10*3/uL — ABNORMAL HIGH (ref 0.00–0.07)
Basophils Absolute: 0 10*3/uL (ref 0.0–0.1)
Basophils Relative: 0 %
Eosinophils Absolute: 0.1 10*3/uL (ref 0.0–0.5)
Eosinophils Relative: 1 %
HCT: 43.7 % (ref 39.0–52.0)
Hemoglobin: 13.4 g/dL (ref 13.0–17.0)
Immature Granulocytes: 1 %
Lymphocytes Relative: 6 %
Lymphs Abs: 1.1 10*3/uL (ref 0.7–4.0)
MCH: 28.9 pg (ref 26.0–34.0)
MCHC: 30.7 g/dL (ref 30.0–36.0)
MCV: 94.2 fL (ref 80.0–100.0)
Monocytes Absolute: 1 10*3/uL (ref 0.1–1.0)
Monocytes Relative: 6 %
Neutro Abs: 15.6 10*3/uL — ABNORMAL HIGH (ref 1.7–7.7)
Neutrophils Relative %: 86 %
Platelets: 156 10*3/uL (ref 150–400)
RBC: 4.64 MIL/uL (ref 4.22–5.81)
RDW: 12.4 % (ref 11.5–15.5)
WBC: 17.9 10*3/uL — ABNORMAL HIGH (ref 4.0–10.5)
nRBC: 0 % (ref 0.0–0.2)

## 2018-06-26 LAB — COMPREHENSIVE METABOLIC PANEL
ALT: 23 U/L (ref 0–44)
AST: 32 U/L (ref 15–41)
Albumin: 4.3 g/dL (ref 3.5–5.0)
Alkaline Phosphatase: 56 U/L (ref 38–126)
Anion gap: 10 (ref 5–15)
BUN: 13 mg/dL (ref 6–20)
CO2: 22 mmol/L (ref 22–32)
Calcium: 8.9 mg/dL (ref 8.9–10.3)
Chloride: 106 mmol/L (ref 98–111)
Creatinine, Ser: 1.52 mg/dL — ABNORMAL HIGH (ref 0.61–1.24)
GFR calc Af Amer: >60 mL/min (ref >60)
GFR calc non Af Amer: 59 mL/min — ABNORMAL LOW (ref >60)
Glucose, Bld: 139 mg/dL — ABNORMAL HIGH (ref 70–99)
Potassium: 4.8 mmol/L (ref 3.5–5.1)
Sodium: 138 mmol/L (ref 135–145)
Total Bilirubin: 0.5 mg/dL (ref 0.3–1.2)
Total Protein: 7.2 g/dL (ref 6.5–8.1)

## 2018-06-26 LAB — ETHANOL: Alcohol, Ethyl (B): <10 mg/dL (ref <10)

## 2018-06-26 LAB — SALICYLATE LEVEL: Salicylate Lvl: <7 mg/dL (ref 2.8–30.0)

## 2018-06-26 LAB — CBG MONITORING, ED: Glucose-Capillary: 116 mg/dL — ABNORMAL HIGH (ref 70–99)

## 2018-06-26 MED ORDER — SODIUM CHLORIDE 0.9 % IV BOLUS
1000.0000 mL | Freq: Once | INTRAVENOUS | Status: AC
  Administered 2018-06-26: 1000 mL via INTRAVENOUS

## 2018-06-26 MED ORDER — ONDANSETRON HCL 4 MG/2ML IJ SOLN
4.0000 mg | Freq: Once | INTRAMUSCULAR | Status: AC
  Administered 2018-06-26: 04:00:00 4 mg via INTRAVENOUS
  Filled 2018-06-26: qty 2

## 2018-06-26 MED ORDER — ONDANSETRON HCL 4 MG PO TABS: 4.0000 mg | ORAL_TABLET | Freq: Four times a day (QID) | ORAL | Status: DC | PRN

## 2018-06-26 MED ORDER — ONDANSETRON HCL 4 MG/2ML IJ SOLN
4.0000 mg | Freq: Four times a day (QID) | INTRAMUSCULAR | Status: DC | PRN
  Administered 2018-06-26: 22:00:00 4 mg via INTRAVENOUS
  Filled 2018-06-26: qty 2

## 2018-06-26 MED ORDER — NALOXONE HCL 4 MG/10ML IJ SOLN
0.2500 mg/h | INTRAVENOUS | Status: DC
  Filled 2018-06-26 (×2): qty 10

## 2018-06-26 MED ORDER — NALOXONE HCL 0.4 MG/ML IJ SOLN
0.4000 mg | INTRAMUSCULAR | Status: DC | PRN
  Administered 2018-06-26 – 2018-06-27 (×5): 0.4 mg via INTRAVENOUS
  Filled 2018-06-26 (×5): qty 1

## 2018-06-26 MED ORDER — ACETAMINOPHEN 650 MG RE SUPP: 650.0000 mg | Freq: Four times a day (QID) | RECTAL | Status: DC | PRN

## 2018-06-26 MED ORDER — ENOXAPARIN SODIUM 40 MG/0.4ML ~~LOC~~ SOLN
40.0000 mg | SUBCUTANEOUS | Status: DC
  Administered 2018-06-26: 22:00:00 40 mg via SUBCUTANEOUS
  Filled 2018-06-26: qty 0.4

## 2018-06-26 MED ORDER — SODIUM CHLORIDE 0.9 % IV SOLN
INTRAVENOUS | Status: DC
  Administered 2018-06-26 – 2018-06-27 (×4): via INTRAVENOUS

## 2018-06-26 MED ORDER — ACETAMINOPHEN 325 MG PO TABS
650.0000 mg | ORAL_TABLET | Freq: Four times a day (QID) | ORAL | Status: DC | PRN
  Administered 2018-06-26 – 2018-06-27 (×2): 650 mg via ORAL
  Filled 2018-06-26 (×2): qty 2

## 2018-06-26 NOTE — H&P (Signed)
History and Physical    Cesar Williams JJH:[id number] DOB: 02-26-1986 DOA: 06/26/2018  PCP: Patient, No Pcp Per  Patient coming from: home   I have personally briefly reviewed patient's old medical records available.   Chief Complaint: Found unresponsive.  HPI: Cesar LONZELL SNAWDER Sr. is a 33 y.o. male with medical history significant of chronic back pain, opiate abuse mostly cocaine as per patient brought to the ER by EMS where he was found unresponsive by his wife at home.  His wife called EMS after he was found unresponsive at home.  Patient tells me that about 8 PM last night he snorted some cocaine, he denied any use of opiates or fentanyl.  Denied any use of injectable drug.  Patient thinks that his cocaine might have been adulterated with fentanyl on it.  In the middle of the night, he was found unresponsive by his wife.  EMS found him with respiratory rate of 6 and pinpoint pupils.  He was given 1.5 mg of Narcan had some response.  Patient also had few drinks of alcohol.  He denies any other preceding symptoms. Patient denies any chest pain, shortness of breath, wheezing.  Denies any palpitations.  Denies any dizziness or lightheadedness.  Denies any abdominal pain diarrhea nausea vomiting. No fever or flulike symptoms.  Patient denies any suicidal or homicidal ideation.  Denies any hallucinations or delusions. ED Course: Patient required another dose of Narcan shortly after arrival.  Due to continuous requirement of Narcan, he is a started on Narcan infusion in the ER. He is currently hemodynamically stable.  Patient has acute kidney injury.  Leukocytosis with no localizing evidence of infection. Urine drug screen pending, however he admitted doing cocaine and probably mixed with fentanyl.  Denies any intentional drug overdose.Tylenol levels and salicylate levels are normal.  Liver function test is normal.  Review of Systems: As per HPI otherwise 10 point review of systems negative.    Past  Medical History:  Diagnosis Date  . Hypertension     History reviewed. No pertinent surgical history.   reports that he has quit smoking. His smoking use included cigarettes. He has never used smokeless tobacco. He reports that he does not drink alcohol or use drugs.  No Known Allergies  History reviewed. No pertinent family history.   Prior to Admission medications   Not on File  Does not take any medicine.  Physical Exam: Vitals:   06/26/18 0633 06/26/18 0645 06/26/18 0700 06/26/18 0715  BP: 97/78 100/76 102/77 100/75  Pulse: 95 96 94 96  Resp: (!) 5 (!) 4 (!) 5   Temp:      TempSrc:      SpO2: 95% 94% 91% 91%  Weight:      Height:        Constitutional: NAD, calm, comfortable Vitals:   06/26/18 0633 06/26/18 0645 06/26/18 0700 06/26/18 0715  BP: 97/78 100/76 102/77 100/75  Pulse: 95 96 94 96  Resp: (!) 5 (!) 4 (!) 5   Temp:      TempSrc:      SpO2: 95% 94% 91% 91%  Weight:      Height:       Eyes: PERRL, lids and conjunctivae normal ENMT: Mucous membranes are moist. Posterior pharynx clear of any exudate or lesions.Normal dentition.  Neck: normal, supple, no masses, no thyromegaly Respiratory: clear to auscultation bilaterally, no wheezing, no crackles. Normal respiratory effort. No accessory muscle use.  Cardiovascular: Regular rate and rhythm, no  murmurs / rubs / gallops. No extremity edema. 2+ pedal pulses. No carotid bruits.  Abdomen: no tenderness, no masses palpated. No hepatosplenomegaly. Bowel sounds positive.  Musculoskeletal: no clubbing / cyanosis. No joint deformity upper and lower extremities. Good ROM, no contractures. Normal muscle tone.  Skin: no rashes, lesions, ulcers. No induration Neurologic: CN 2-12 grossly intact. Sensation intact, DTR normal. Strength 5/5 in all 4.  Psychiatric: Normal judgment and insight. Alert and oriented x 3. Normal mood.     Labs on Admission: I have personally reviewed following labs and imaging studies  CBC:  Recent Labs  Lab 06/26/18 0358  WBC 17.9*  NEUTROABS 15.6*  HGB 13.4  HCT 43.7  MCV 94.2  PLT 156   Basic Metabolic Panel: Recent Labs  Lab 06/26/18 0358  NA 138  K 4.8  CL 106  CO2 22  GLUCOSE 139*  BUN 13  CREATININE 1.52*  CALCIUM 8.9   GFR: Estimated Creatinine Clearance: 76.7 mL/min (A) (by C-G formula based on SCr of 1.52 mg/dL (H)). Liver Function Tests: Recent Labs  Lab 06/26/18 0358  AST 32  ALT 23  ALKPHOS 56  BILITOT 0.5  PROT 7.2  ALBUMIN 4.3   No results for input(s): LIPASE, AMYLASE in the last 168 hours. No results for input(s): AMMONIA in the last 168 hours. Coagulation Profile: No results for input(s): INR, PROTIME in the last 168 hours. Cardiac Enzymes: No results for input(s): CKTOTAL, CKMB, CKMBINDEX, TROPONINI in the last 168 hours. BNP (last 3 results) No results for input(s): PROBNP in the last 8760 hours. HbA1C: No results for input(s): HGBA1C in the last 72 hours. CBG: Recent Labs  Lab 06/26/18 0357  GLUCAP 116*   Lipid Profile: No results for input(s): CHOL, HDL, LDLCALC, TRIG, CHOLHDL, LDLDIRECT in the last 72 hours. Thyroid Function Tests: No results for input(s): TSH, T4TOTAL, FREET4, T3FREE, THYROIDAB in the last 72 hours. Anemia Panel: No results for input(s): VITAMINB12, FOLATE, FERRITIN, TIBC, IRON, RETICCTPCT in the last 72 hours. Urine analysis:    Component Value Date/Time   COLORURINE COLORLESS (A) 09/12/2016 2152   APPEARANCEUR CLEAR 09/12/2016 2152   LABSPEC 1.001 (L) 09/12/2016 2152   PHURINE 6.0 09/12/2016 2152   GLUCOSEU NEGATIVE 09/12/2016 2152   HGBUR NEGATIVE 09/12/2016 2152   BILIRUBINUR NEGATIVE 09/12/2016 2152   KETONESUR NEGATIVE 09/12/2016 2152   PROTEINUR NEGATIVE 09/12/2016 2152   NITRITE NEGATIVE 09/12/2016 2152   LEUKOCYTESUR NEGATIVE 09/12/2016 2152    Radiological Exams on Admission: No results found.  EKG: Independently reviewed. Sinus rhythm.  No acute ST-T wave changes.   Assessment/Plan Principal Problem:   Drug overdose, accidental or unintentional, initial encounter Active Problems:   AKI (acute kidney injury) (HCC)   Dehydration, moderate     1.  Drug overdose, accidental or unintentional insulin encounter: Patient is clinically responding to multiple doses of Narcan infusions. Agree with admission to stepdown unit given severity of symptoms.  We will continue Narcan drip for now.  We will gradually titrate. Neuro checks every 4 hours. Will allow activities with supervision and monitor.  2.  Acute renal failure: Due to #1.  Will hydrate and monitor levels.  Recheck levels in the morning.  3.  Leukocytosis: With no evidence of localizing infection.  Probably due to dehydration or hemoconcentration.  Will recheck after hydration.  4.  Multiple drug use: Counseling was done.  Patient was offered help.  Patient is stated that he does not do drugs all the time.  He does  cocaine and he denies any need for help.  Denies any intention to harm.  Will be seen by case management.   DVT prophylaxis: Lovenox. Code Status: Full code. Family Communication: No family present.  Patient with decision-making capacity. Disposition Plan: Home when is stable.  Anticipate tomorrow. Consults called: None. Admission status: Observation.  Progressive.   Dorcas Carrow MD [facility] Pager [phone]  If 7PM-7AM, please contact night-coverage [email or web address] Password [email or web address]  06/26/2018, 7:59 AM

## 2018-06-26 NOTE — ED Notes (Signed)
Pt admits to this RN that he snorted cocaine tonight and that "they must've put fentanyl in it." MD aware.

## 2018-06-26 NOTE — ED Triage Notes (Signed)
BIB EMS from home. Wife called EMS after finding pt unresponsive at home. Per EMS, pt initially presented with RR6 and pinpoint pupils. Pt received total 1.5mg  narcan by EMS. Now pt is awake, breathing WNL, oriented to baseline. Pt denies opiates, states ETOH only tonight. Pt denies recent travel, fevers, cough, SOB.

## 2018-06-26 NOTE — ED Notes (Signed)
ED TO INPATIENT HANDOFF REPORT  ED Nurse Name and Phone #: Toniann Fail [phone]  S Name/Age/Gender Cesar Marcina Millard Sr. 33 y.o. male Room/Bed: RESUSC/RESUSC  Code Status   Code Status: Not on file  Home/SNF/Other Home Patient oriented to: self, place, time and situation Is this baseline? Yes   Triage Complete: Triage complete  Chief Complaint overdose; AMS   Triage Note BIB EMS from home. Wife called EMS after finding pt unresponsive at home. Per EMS, pt initially presented with RR6 and pinpoint pupils. Pt received total 1.5mg  narcan by EMS. Now pt is awake, breathing WNL, oriented to baseline. Pt denies opiates, states ETOH only tonight. Pt denies recent travel, fevers, cough, SOB.    Allergies No Known Allergies  Level of Care/Admitting Diagnosis ED Disposition    ED Disposition Condition Comment   Admit  Hospital Area: [facility] [[id number]]  Level of Care: Progressive [102]  I expect the patient will be discharged within 24 hours: No (not a candidate for 5C-Observation unit)  Diagnosis: Drug overdose, accidental or unintentional, initial encounter [4034742]  Admitting Physician: Dorcas Carrow [[id number]]  Attending Physician: Dorcas Carrow [[id number]]  PT Class (Do Not Modify): Observation [104]  PT Acc Code (Do Not Modify): Observation [10022]       B Medical/Surgery History Past Medical History:  Diagnosis Date  . Hypertension    History reviewed. No pertinent surgical history.   A IV Location/Drains/Wounds Patient Lines/Drains/Airways Status   Active Line/Drains/Airways    Name:   Placement date:   Placement time:   Site:   Days:   Peripheral IV 06/26/18 Right;Upper Arm   06/26/18    0315    Arm   less than 1          Intake/Output Last 24 hours  Intake/Output Summary (Last 24 hours) at 06/26/2018 0858 Last data filed at 06/26/2018 0429 Gross per 24 hour  Intake 2000 ml  Output -  Net 2000 ml    Labs/Imaging Results for orders placed or  performed during the hospital encounter of 06/26/18 (from the past 48 hour(s))  CBG monitoring, ED     Status: Abnormal   Collection Time: 06/26/18  3:57 AM  Result Value Ref Range   Glucose-Capillary 116 (H) 70 - 99 mg/dL  Comprehensive metabolic panel     Status: Abnormal   Collection Time: 06/26/18  3:58 AM  Result Value Ref Range   Sodium 138 135 - 145 mmol/L   Potassium 4.8 3.5 - 5.1 mmol/L   Chloride 106 98 - 111 mmol/L   CO2 22 22 - 32 mmol/L   Glucose, Bld 139 (H) 70 - 99 mg/dL   BUN 13 6 - 20 mg/dL   Creatinine, Ser 5.18 (H) 0.61 - 1.24 mg/dL   Calcium 8.9 8.9 - 84.1 mg/dL   Total Protein 7.2 6.5 - 8.1 g/dL   Albumin 4.3 3.5 - 5.0 g/dL   AST 32 15 - 41 U/L   ALT 23 0 - 44 U/L   Alkaline Phosphatase 56 38 - 126 U/L   Total Bilirubin 0.5 0.3 - 1.2 mg/dL   GFR calc non Af Amer 59 (L) >60 mL/min   GFR calc Af Amer >60 >60 mL/min   Anion gap 10 5 - 15    Comment: Performed at [facility], [address], [place], [place] [address]  Salicylate level     Status: None   Collection Time: 06/26/18  3:58 AM  Result Value Ref Range  Salicylate Lvl <7.0 2.8 - 30.0 mg/dL    Comment: Performed at [facility], [address], [place], [place] [address]  Acetaminophen level     Status: Abnormal   Collection Time: 06/26/18  3:58 AM  Result Value Ref Range   Acetaminophen (Tylenol), Serum <10 (L) 10 - 30 ug/mL    Comment: (NOTE) Therapeutic concentrations vary significantly. A range of 10-30 ug/mL  may be an effective concentration for many patients. However, some  are best treated at concentrations outside of this range. Acetaminophen concentrations >150 ug/mL at 4 hours after ingestion  and >50 ug/mL at 12 hours after ingestion are often associated with  toxic reactions. Performed at [facility], [address] [place], [place] [address]   Ethanol     Status: None   Collection Time: 06/26/18  3:58 AM  Result Value Ref Range   Alcohol, Ethyl (B) <10 <10  mg/dL    Comment: (NOTE) Lowest detectable limit for serum alcohol is 10 mg/dL. For medical purposes only. Performed at [facility], [address] [place], [place] [address]   CBC WITH DIFFERENTIAL     Status: Abnormal   Collection Time: 06/26/18  3:58 AM  Result Value Ref Range   WBC 17.9 (H) 4.0 - 10.5 K/uL   RBC 4.64 4.22 - 5.81 MIL/uL   Hemoglobin 13.4 13.0 - 17.0 g/dL   HCT 83.1 51.7 - 61.6 %   MCV 94.2 80.0 - 100.0 fL   MCH 28.9 26.0 - 34.0 pg   MCHC 30.7 30.0 - 36.0 g/dL   RDW 07.3 71.0 - 62.6 %   Platelets 156 150 - 400 K/uL   nRBC 0.0 0.0 - 0.2 %   Neutrophils Relative % 86 %   Neutro Abs 15.6 (H) 1.7 - 7.7 K/uL   Lymphocytes Relative 6 %   Lymphs Abs 1.1 0.7 - 4.0 K/uL   Monocytes Relative 6 %   Monocytes Absolute 1.0 0.1 - 1.0 K/uL   Eosinophils Relative 1 %   Eosinophils Absolute 0.1 0.0 - 0.5 K/uL   Basophils Relative 0 %   Basophils Absolute 0.0 0.0 - 0.1 K/uL   Immature Granulocytes 1 %   Abs Immature Granulocytes 0.09 (H) 0.00 - 0.07 K/uL    Comment: Performed at [facility], [address], [place], [place] [address]   No results found.  Pending Labs Unresulted Labs (From admission, onward)    Start     Ordered   06/26/18 0328  Urinalysis, Routine w reflex microscopic  Once,   R     06/26/18 0327   06/26/18 0327  Urine rapid drug screen (hosp performed)  ONCE - STAT,   STAT     06/26/18 0327   Signed and Held  HIV antibody (Routine Testing)  Once,   R     Signed and Held   Signed and Held  Basic metabolic panel  Tomorrow morning,   R     Signed and Held   Signed and Held  CBC  Tomorrow morning,   R     Signed and Held          Vitals/Pain Today's Vitals   06/26/18 0815 06/26/18 0830 06/26/18 0845 06/26/18 0849  BP: 95/70 (!) 84/69    Pulse: 89 89 89   Resp: 12 10 11    Temp:      TempSrc:      SpO2: 93% 96% 96%   Weight:  Height:      PainSc:    0-No pain    Isolation Precautions No active  isolations  Medications Medications  sodium chloride 0.9 % bolus 1,000 mL (0 mLs Intravenous Stopped 06/26/18 0429)    And  0.9 %  sodium chloride infusion ( Intravenous New Bag/Given 06/26/18 0430)  naloxone (NARCAN) injection 0.4 mg (0.4 mg Intravenous Given 06/26/18 0715)  naloxone HCl (NARCAN) 4 mg in dextrose 5 % 250 mL infusion (has no administration in time range)  ondansetron (ZOFRAN) injection 4 mg (4 mg Intravenous Given 06/26/18 0352)    Mobility walks Moderate fall risk   Focused Assessments Cardiac Assessment Handoff:  Cardiac Rhythm: Normal sinus rhythm No results found for: CKTOTAL, CKMB, CKMBINDEX, TROPONINI No results found for: DDIMER Does the Patient currently have chest pain? No   , Neuro Assessment Handoff:  Swallow screen pass? N/a  Cardiac Rhythm: Normal sinus rhythm   Last date known well: 06/26/18   Neuro Assessment:   Neuro Checks:      Last Documented NIHSS Modified Score:   Has TPA been given? No If patient is a Neuro Trauma and patient is going to OR before floor call report to 4N Charge nurse: [phone] or [phone]  , Pulmonary Assessment Handoff:  Lung sounds:   O2 Device: Nasal Cannula O2 Flow Rate (L/min): 4 L/min      R Recommendations: See Admitting Provider Note  Report given to:   Additional Notes:

## 2018-06-26 NOTE — Progress Notes (Signed)
Last dose of narcan was 715.  Narcan drip not been started yet. He has no respiratory depression, alert and awake. 2 hours post injection stable.   Plan: Holding on drip.  Close monitoring of hemodynamics. Continue pulse ox If again needs any narcan, will start drip and transfer to ICU.

## 2018-06-26 NOTE — Progress Notes (Signed)
Cesar MODESTE Sr. [id number]  Code Status: FULL  Admission Data: 06/26/2018 11:47 AM  Attending Provider: Dorcas Carrow  DSK:[id number], No Pcp Per  Consults/ Treatment Team:   Cesar Server Sr. is a 32 y.o. male patient admitted from ED awake, alert - oriented X 3 - no acute distress noted. VSS - Blood pressure (!) 102/17, pulse 79, temperature (!) 97.2 F (36.2 C), temperature source Oral, resp. rate 13, height 6\' 1"  (1.854 m), weight 83.9 kg, SpO2 97 %. no c/o shortness of breath, no c/o chest pain. Orientation to room, and floor completed with information packet given to patient/family. Admission INP armband ID verified with patient/family, and in place.  Fall assessment complete, with patient and family able to verbalize understanding of risk associated with falls, and verbalized understanding to call nsg before up out of bed. Call light within reach, patient able to voice, and demonstrate understanding. Skin, clean-dry- intact without evidence of bruising, or skin tears.  No evidence of skin break down noted on exam.  ?  Will cont to eval and treat per MD orders.  Jon Gills, RN  06/26/2018 11:47 AM

## 2018-06-26 NOTE — ED Provider Notes (Signed)
[facility] EM[facility]GENCY [facility] Provider Note  CSN: [id number] Arrival date & time: 06/26/18 [id number]  Chief Complaint(s) Drug Overdose and Altered Mental Status  BIB EMS from home. Wife called EMS after finding pt unresponsive at home. Per EMS, pt initially presented with RR6 and pinpoint pupils. Pt received total 1.5mg  narcan by EMS. Now pt is awake, breathing WNL, oriented to baseline. Pt denies opiates, states ETOH only tonight. Pt denies recent travel, fevers, cough, SOB.   HPI Cesar Marcina MillardS Marsland Sr. is a 33 y.o. male brought in by EMS after being called out to the patient's home for being unresponsive.  Patient responded to 1-1/2 mg of Narcan after they noticed low respiratory rate and pinpoint pupils.  Patient is now awake and oriented.  Denies any opiate use.  Endorses history of marijuana use but not in the last several days.  Also endorsed drinking a few shots of vodka last night.  States that the only pain medicine he takes is aspirin and Tylenol.  Denies any other recreational drugs.  Denies any physical complaints including chest pain, shortness of breath, nausea, vomiting, abdominal pain.  He denies any recent fevers or infections.  HPI  Past Medical History Past Medical History:  Diagnosis Date  . Hypertension    There are no active problems to display for this patient.  Home Medication(s) Prior to Admission medications   Medication Sig Start Date End Date Taking? Authorizing Provider  ibuprofen (ADVIL,MOTRIN) 800 MG tablet Take 1 tablet (800 mg total) by mouth every 8 (eight) hours as needed for mild pain or moderate pain. 10/16/14  Yes West, Emily, PA-C  cephALEXin (KEFLEX) 500 MG capsule Take 1 capsule (500 mg total) by mouth 4 (four) times daily. Patient not taking: Reported on 10/16/2014 11/10/13   Derwood KaplanNanavati, Ankit, MD  cyclobenzaprine (FLEXERIL) 10 MG tablet Take 1 tablet (10 mg total) by mouth 3 (three) times daily as needed for muscle spasms (or pain). Patient  not taking: Reported on 06/26/2018 09/13/16   Fayrene Helperran, Bowie, PA-C  diphenhydrAMINE (BENADRYL) 25 mg capsule Take 1 capsule (25 mg total) by mouth every 6 (six) hours as needed for itching. Patient not taking: Reported on 10/16/2014 11/10/13   Derwood KaplanNanavati, Ankit, MD  naproxen (NAPROSYN) 500 MG tablet Take 1 tablet (500 mg total) by mouth 2 (two) times daily. Patient not taking: Reported on 06/26/2018 09/13/16   Fayrene Helperran, Bowie, PA-C  predniSONE (DELTASONE) 50 MG tablet Take 1 tablet (50 mg total) by mouth daily. Patient not taking: Reported on 10/16/2014 11/10/13   Derwood KaplanNanavati, Ankit, MD  promethazine (PHENERGAN) 25 MG tablet Take 1 tablet (25 mg total) by mouth every 6 (six) hours as needed for nausea. Patient not taking: Reported on 06/26/2018 10/20/14   Fayrene Helperran, Bowie, PA-C  traMADol (ULTRAM) 50 MG tablet Take 1 tablet (50 mg total) by mouth every 6 (six) hours as needed. Patient not taking: Reported on 10/16/2014 11/10/13   Derwood KaplanNanavati, Ankit, MD  Past Surgical History History reviewed. No pertinent surgical history. Family History History reviewed. No pertinent family history.  Social History Social History   Tobacco Use  . Smoking status: Former Smoker    Types: Cigarettes  . Smokeless tobacco: Never Used  Substance Use Topics  . Alcohol use: No  . Drug use: No   Allergies Patient has no known allergies.  Review of Systems Review of Systems All other systems are reviewed and are negative for acute change except as noted in the HPI  Physical Exam Vital Signs  I have reviewed the triage vital signs BP 110/90   Pulse 94   Temp (!) 97.2 F (36.2 C) (Oral)   Resp 17   Ht  (1.854 m)   Wt 83.9 kg   SpO2 99%   BMI 24.41 kg/m   Physical Exam Vitals signs reviewed.  Constitutional:      General: He is not in acute distress.    Appearance: He is well-developed. He is  not diaphoretic.  HENT:     Head: Normocephalic and atraumatic.     Nose: Nose normal.  Eyes:     General: No scleral icterus.       Right eye: No discharge.        Left eye: No discharge.     Conjunctiva/sclera: Conjunctivae normal.     Pupils: Pupils are equal.     Right eye: Pupil is round.     Left eye: Pupil is round.     Comments: Pin point pupils  Neck:     Musculoskeletal: Normal range of motion and neck supple.  Cardiovascular:     Rate and Rhythm: Normal rate and regular rhythm.     Heart sounds: No murmur. No friction rub. No gallop.   Pulmonary:     Effort: Pulmonary effort is normal. No respiratory distress.     Breath sounds: Normal breath sounds. No stridor. No rales.  Abdominal:     General: There is no distension.     Palpations: Abdomen is soft.     Tenderness: There is no abdominal tenderness.  Musculoskeletal:        General: No tenderness.  Skin:    General: Skin is warm and dry.     Findings: No erythema or rash.  Neurological:     Mental Status: He is alert and oriented to person, place, and time.     Cranial Nerves: Cranial nerves are intact.     Sensory: Sensation is intact.     Motor: Motor function is intact.     ED Results and Treatments Labs (all labs ordered are listed, but only abnormal results are displayed) Labs Reviewed  COMPREHENSIVE METABOLIC PANEL - Abnormal; Notable for the following components:      Result Value   Glucose, Bld 139 (*)    Creatinine, Ser 1.52 (*)    GFR calc non Af Amer 59 (*)    All other components within normal limits  ACETAMINOPHEN LEVEL - Abnormal; Notable for the following components:   Acetaminophen (Tylenol), Serum <10 (*)    All other components within normal limits  CBC WITH DIFFERENTIAL/PLATELET - Abnormal; Notable for the following components:   WBC 17.9 (*)    Neutro Abs 15.6 (*)    Abs Immature Granulocytes 0.09 (*)    All other components within normal limits  CBG MONITORING, ED - Abnormal;  Notable for the following components:   Glucose-Capillary 116 (*)    All other components  within normal limits  SALICYLATE LEVEL  ETHANOL  RAPID URINE DRUG SCREEN, HOSP PERFORMED  URINALYSIS, ROUTINE W REFLEX MICROSCOPIC  CBG MONITORING, ED                                                                                                                         EKG  EKG Interpretation  Date/Time:  Wednesday June 26 2018 03:18:46 EDT Ventricular Rate:  96 PR Interval:    QRS Duration: 97 QT Interval:  357 QTC Calculation: 452 R Axis:   60 Text Interpretation:  Sinus rhythm movement artifact NO STEMI. No old tracing to compare Confirmed by Drema Pry [phone]) on 06/26/2018 4:02:20 AM      Radiology No results found. Pertinent labs & imaging results that were available during my care of the patient were reviewed by me and considered in my medical decision making (see chart for details).  Medications Ordered in ED Medications  sodium chloride 0.9 % bolus 1,000 mL (0 mLs Intravenous Stopped 06/26/18 0429)    And  0.9 %  sodium chloride infusion ( Intravenous New Bag/Given 06/26/18 0430)  naloxone [facility]) injection 0.4 mg (0.4 mg Intravenous Given 06/26/18 0608)  naloxone HCl (NARCAN) 4 mg in dextrose 5 % 250 mL infusion (has no administration in time range)  ondansetron (ZOFRAN) injection 4 mg (4 mg Intravenous Given 06/26/18 0352)                                                                                                                                    Procedures .Critical Care Performed by: Nira Conn, MD Authorized by: Nira Conn, MD     CRITICAL CARE Performed by: Amadeo Garnet Alaney Witter Total critical care time: 45 minutes Critical care time was exclusive of separately billable procedures and treating other patients. Critical care was necessary to treat or prevent imminent or life-threatening deterioration. Critical care was time spent personally  by me on the following activities: development of treatment plan with patient and/or surrogate as well as nursing, discussions with consultants, evaluation of patient's response to treatment, examination of patient, obtaining history from patient or surrogate, ordering and performing treatments and interventions, ordering and review of laboratory studies, ordering and review of radiographic studies, pulse oximetry and re-evaluation of patient's condition.   (including critical care time)  Medical Decision Making / ED Course I have reviewed the nursing notes for this  encounter and the patient's prior records (if available in EHR or on provided paperwork).    Patient presented as a possible opiate overdose but patient is declining any use.  Overdose work-up was initiated.  Patient required second dose of Narcan shortly after arrival.  Afterward patient did admit to using cocaine which might of had fentanyl in.  Patient remained hemodynamically stable and was monitored.   Work-up revealed leukocytosis without anemia.  No significant electrolyte derangements.  There is mild renal insufficiency.  Aspirin, acetaminophen and alcohol negative.  No need to repeat acetaminophen as this is more than 4 hours past last use.  Patient continued to become somnolent and required additional Narcan.  He was placed on Narcan drip and will require admission for continued management.  Final Clinical Impression(s) / ED Diagnoses Final diagnoses:  Polysubstance abuse (HCC)  Accidental drug overdose, initial encounter      This chart was dictated using voice recognition software.  Despite best efforts to proofread,  errors can occur which can change the documentation meaning.   Nira Conn, MD 06/26/18 [phone]

## 2018-06-27 LAB — BASIC METABOLIC PANEL
Anion gap: 7 (ref 5–15)
BUN: 6 mg/dL (ref 6–20)
CO2: 23 mmol/L (ref 22–32)
Calcium: 8.2 mg/dL — ABNORMAL LOW (ref 8.9–10.3)
Chloride: 106 mmol/L (ref 98–111)
Creatinine, Ser: 1.09 mg/dL (ref 0.61–1.24)
GFR calc Af Amer: >60 mL/min (ref >60)
GFR calc non Af Amer: >60 mL/min (ref >60)
Glucose, Bld: 82 mg/dL (ref 70–99)
Potassium: 4 mmol/L (ref 3.5–5.1)
Sodium: 136 mmol/L (ref 135–145)

## 2018-06-27 LAB — CBC
HCT: 36.3 % — ABNORMAL LOW (ref 39.0–52.0)
Hemoglobin: 12.1 g/dL — ABNORMAL LOW (ref 13.0–17.0)
MCH: 30.7 pg (ref 26.0–34.0)
MCHC: 33.3 g/dL (ref 30.0–36.0)
MCV: 92.1 fL (ref 80.0–100.0)
Platelets: 129 10*3/uL — ABNORMAL LOW (ref 150–400)
RBC: 3.94 MIL/uL — ABNORMAL LOW (ref 4.22–5.81)
RDW: 12.4 % (ref 11.5–15.5)
WBC: 7.2 10*3/uL (ref 4.0–10.5)
nRBC: 0 % (ref 0.0–0.2)

## 2018-06-27 LAB — HIV ANTIBODY (ROUTINE TESTING W REFLEX): HIV Screen 4th Generation wRfx: NONREACTIVE

## 2018-06-27 MED ORDER — NALOXONE HCL 2 MG/2ML IJ SOSY
1.0000 mg | PREFILLED_SYRINGE | Freq: Once | INTRAMUSCULAR | Status: AC
  Administered 2018-06-27: 1 mg via INTRAVENOUS
  Filled 2018-06-27: qty 2

## 2018-06-27 NOTE — Discharge Instructions (Signed)
Follow with Primary MD  in 7 days   Get CBC, CMP  checked  by Primary MD n 5-7 days    Activity: As tolerated with Full fall precautions use walker/cane & assistance as needed  Disposition Home    Diet: Heart Healthy    Special Instructions: If you have smoked or chewed Tobacco  in the last 2 yrs please stop smoking, stop any regular Alcohol  and or any Recreational drug use.  On your next visit with your primary care physician please Get Medicines reviewed and adjusted.  Please request your Prim.MD to go over all Hospital Tests and Procedure/Radiological results at the follow up, please get all Hospital records sent to your Prim MD by signing hospital release before you go home.  If you experience worsening of your admission symptoms, develop shortness of breath, life threatening emergency, suicidal or homicidal thoughts you must seek medical attention immediately by calling 911 or calling your MD immediately  if symptoms less severe.  You Must read complete instructions/literature along with all the possible adverse reactions/side effects for all the Medicines you take and that have been prescribed to you. Take any new Medicines after you have completely understood and accpet all the possible adverse reactions/side effects.   Do not drive, operate heavy machinery, perform activities at heights, swimming or participation in water activities or provide baby sitting services if your were admitted for syncope or siezures until you have seen by Primary MD or a Neurologist and advised to do so again.  Do not drive when taking Pain medications.  Do not take more than prescribed Pain, Sleep and Anxiety Medications  Wear Seat belts while driving.   Please note  You were cared for by a hospitalist during your hospital stay. If you have any questions about your discharge medications or the care you received while you were in the hospital after you are discharged, you can call the unit and asked  to speak with the hospitalist on call if the hospitalist that took care of you is not available. Once you are discharged, your primary care physician will handle any further medical issues. Please note that NO REFILLS for any discharge medications will be authorized once you are discharged, as it is imperative that you return to your primary care physician (or establish a relationship with a primary care physician if you do not have one) for your aftercare needs so that they can reassess your need for medications and monitor your lab values.

## 2018-06-27 NOTE — Progress Notes (Signed)
Nsg Discharge Note  Admit Date:  06/26/2018 Discharge date: 06/27/2018   Cesar S Gonnerman Sr. to be D/C'd Home per MD order.  AVS completed.  Copy for chart, and copy for patient signed, and dated. Patient/caregiver able to verbalize understanding.  Discharge Medication: Allergies as of 06/27/2018   No Known Allergies     Medication List    You have not been prescribed any medications.     Discharge Assessment: Vitals:   06/27/18 0805 06/27/18 1150  BP:    Pulse:    Resp:    Temp: 98.3 F (36.8 C) 98.3 F (36.8 C)  SpO2:     Skin clean, dry and intact without evidence of skin break down, no evidence of skin tears noted. IV catheter discontinued intact. Site without signs and symptoms of complications - no redness or edema noted at insertion site, patient denies c/o pain - only slight tenderness at site.  Dressing with slight pressure applied.  D/c Instructions-Education: Discharge instructions given to patient/family with verbalized understanding. D/c education completed with patient/family including follow up instructions, medication list, d/c activities limitations if indicated, with other d/c instructions as indicated by MD - patient able to verbalize understanding, all questions fully answered. Patient instructed to return to ED, call 911, or call MD for any changes in condition.  Patient escorted via WC, and D/C home via private auto.  Lyndal Pulley, RN 06/27/2018 1:47 PM

## 2018-06-27 NOTE — Discharge Summary (Signed)
Cesar KOTHARI Sr. ZOX:[id number] DOB: 1985/06/30 DOA: 06/26/2018  PCP: Patient, No Pcp Per  Admit date: 06/26/2018  Discharge date: 06/27/2018  Admitted From: Home   Disposition:  Home   Recommendations for Outpatient Follow-up:   Follow up with PCP in 1-2 weeks  PCP Please obtain BMP/CBC, 2 view CXR in 1week,  (see Discharge instructions)   PCP Please follow up on the following pending results:    Home Health: None   Equipment/Devices: None  Consultations: None Discharge Condition: Satble   CODE STATUS: Full   Diet Recommendation: Heart Healthy     Chief Complaint  Patient presents with  . Drug Overdose  . Altered Mental Status     Brief history of present illness from the day of admission and additional interim summary    Cesar S Leory Allinson. is a 33 y.o. male with medical history significant of chronic back pain, opiate abuse mostly cocaine as per patient brought to the ER by EMS where he was found unresponsive by his wife at home, work-up showed unintentional opioid overdose.                                                                 Hospital Course    Toxic encephalopathy due to unintentional opioid overdose in a patient with known history of opioid and cocaine abuse.  Treated with supportive care including Narcan use, this morning awake walking in his room with a urinal in his hand, mentation back to baseline, counseled not to use illegally obtained narcotics and cocaine.  Symptom-free will be discharged home.   Discharge diagnosis     Principal Problem:   Drug overdose, accidental or unintentional, initial encounter Active Problems:   AKI (acute kidney injury) (HCC)   Dehydration, moderate    Discharge instructions    Discharge Instructions    Diet - low sodium heart healthy   Complete  by:  As directed    Discharge instructions   Complete by:  As directed    Follow with Primary MD  in 7 days   Get CBC, CMP  checked  by Primary MD n 5-7 days    Activity: As tolerated with Full fall precautions use walker/cane & assistance as needed  Disposition Home    Diet: Heart Healthy    Special Instructions: If you have smoked or chewed Tobacco  in the last 2 yrs please stop smoking, stop any regular Alcohol  and or any Recreational drug use.  On your next visit with your primary care physician please Get Medicines reviewed and adjusted.  Please request your Prim.MD to go over all Hospital Tests and Procedure/Radiological results at the follow up, please get all Hospital records sent to your Prim MD by signing hospital release before you go home.  If you  experience worsening of your admission symptoms, develop shortness of breath, life threatening emergency, suicidal or homicidal thoughts you must seek medical attention immediately by calling 911 or calling your MD immediately  if symptoms less severe.  You Must read complete instructions/literature along with all the possible adverse reactions/side effects for all the Medicines you take and that have been prescribed to you. Take any new Medicines after you have completely understood and accpet all the possible adverse reactions/side effects.   Do not drive, operate heavy machinery, perform activities at heights, swimming or participation in water activities or provide baby sitting services if your were admitted for syncope or siezures until you have seen by Primary MD or a Neurologist and advised to do so again.  Do not drive when taking Pain medications.  Do not take more than prescribed Pain, Sleep and Anxiety Medications  Wear Seat belts while driving.   Please note  You were cared for by a hospitalist during your hospital stay. If you have any questions about your discharge medications or the care you received while you  were in the hospital after you are discharged, you can call the unit and asked to speak with the hospitalist on call if the hospitalist that took care of you is not available. Once you are discharged, your primary care physician will handle any further medical issues. Please note that NO REFILLS for any discharge medications will be authorized once you are discharged, as it is imperative that you return to your primary care physician (or establish a relationship with a primary care physician if you do not have one) for your aftercare needs so that they can reassess your need for medications and monitor your lab values.   Increase activity slowly   Complete by:  As directed       Discharge Medications   Allergies as of 06/27/2018   No Known Allergies     Medication List    You have not been prescribed any medications.     Follow-up Information    [facility]. Schedule an appointment as soon as possible for a visit in 1 week(s).   Contact information: [address] [place] [address] [phone]          Major procedures and Radiology Reports - PLEASE review detailed and final reports thoroughly  -         No results found.  Micro Results     No results found for this or any previous visit (from the past 240 hour(s)).  Today   Subjective    Cesar Williams today has no headache,no chest abdominal pain,no new weakness tingling or numbness, feels much better wants to go home today. Walking in his room with a urinal in his hand.   Objective   Blood pressure 99/63, pulse 72, temperature 98.8 F (37.1 C), temperature source Oral, resp. rate 10, height 6\' 1"  (1.854 m), weight 83.9 kg, SpO2 98 %.   Intake/Output Summary (Last 24 hours) at 06/27/2018 0821 Last data filed at 06/27/2018 0548 Gross per 24 hour  Intake 1860 ml  Output 1251 ml  Net 609 ml    Exam  Awake Alert, Oriented x 3, No new F.N deficits, Normal  affect [place].AT,PERRAL Supple Neck,No JVD, No cervical lymphadenopathy appriciated.  Symmetrical Chest wall movement, Good air movement bilaterally, CTAB RRR,No Gallops,Rubs or new Murmurs, No Parasternal Heave +ve B.Sounds, Abd Soft, Non tender, No organomegaly appriciated, No rebound -guarding or rigidity. No  Cyanosis, Clubbing or edema, No new Rash or bruise   Data Review   CBC w Diff:  Lab Results  Component Value Date   WBC 7.2 06/27/2018   HGB 12.1 (L) 06/27/2018   HCT 36.3 (L) 06/27/2018   PLT 129 (L) 06/27/2018   LYMPHOPCT 6 06/26/2018   MONOPCT 6 06/26/2018   EOSPCT 1 06/26/2018   BASOPCT 0 06/26/2018    CMP:  Lab Results  Component Value Date   NA 136 06/27/2018   K 4.0 06/27/2018   CL 106 06/27/2018   CO2 23 06/27/2018   BUN 6 06/27/2018   CREATININE 1.09 06/27/2018   PROT 7.2 06/26/2018   ALBUMIN 4.3 06/26/2018   BILITOT 0.5 06/26/2018   ALKPHOS 56 06/26/2018   AST 32 06/26/2018   ALT 23 06/26/2018  .   Total Time in preparing paper work, data evaluation and todays exam - 35 minutes  Susa Raring M.D on 06/27/2018 at 8:21 AM  [facility]   Office  [phone]

## 2018-11-29 ENCOUNTER — Other Ambulatory Visit: Payer: Self-pay

## 2018-11-29 DIAGNOSIS — Z20822 Contact with and (suspected) exposure to covid-19: Secondary | ICD-10-CM

## 2018-12-01 LAB — NOVEL CORONAVIRUS, NAA: SARS-CoV-2, NAA: NOT DETECTED

## 2018-12-16 ENCOUNTER — Other Ambulatory Visit: Payer: Self-pay | Admitting: Cardiology

## 2018-12-16 DIAGNOSIS — Z20822 Contact with and (suspected) exposure to covid-19: Secondary | ICD-10-CM

## 2018-12-18 LAB — NOVEL CORONAVIRUS, NAA: SARS-CoV-2, NAA: NOT DETECTED

## 2019-04-22 ENCOUNTER — Ambulatory Visit: Payer: Self-pay | Attending: Internal Medicine

## 2019-04-22 DIAGNOSIS — Z20822 Contact with and (suspected) exposure to covid-19: Secondary | ICD-10-CM | POA: Insufficient documentation

## 2019-04-23 LAB — NOVEL CORONAVIRUS, NAA: SARS-CoV-2, NAA: NOT DETECTED

## 2019-12-04 ENCOUNTER — Ambulatory Visit (INDEPENDENT_AMBULATORY_CARE_PROVIDER_SITE_OTHER): Payer: Self-pay | Admitting: Primary Care

## 2019-12-10 ENCOUNTER — Ambulatory Visit (INDEPENDENT_AMBULATORY_CARE_PROVIDER_SITE_OTHER): Payer: Self-pay | Admitting: Primary Care

## 2019-12-21 ENCOUNTER — Emergency Department (HOSPITAL_COMMUNITY)
Admission: EM | Admit: 2019-12-21 | Discharge: 2019-12-26 | Disposition: E | Payer: Self-pay | Attending: Emergency Medicine | Admitting: Emergency Medicine

## 2019-12-21 LAB — CBG MONITORING, ED: Glucose-Capillary: 281 mg/dL — ABNORMAL HIGH (ref 70–99)

## 2019-12-21 MED ORDER — SODIUM CHLORIDE 0.9 % IV BOLUS: 1000.0000 mL | Freq: Once | INTRAVENOUS | Status: DC

## 2019-12-26 DEATH — deceased

## 2021-11-25 ENCOUNTER — Emergency Department (HOSPITAL_COMMUNITY): Payer: Self-pay

## 2021-11-25 ENCOUNTER — Encounter (HOSPITAL_COMMUNITY): Payer: Self-pay

## 2021-11-25 ENCOUNTER — Emergency Department (HOSPITAL_COMMUNITY)
Admission: EM | Admit: 2021-11-25 | Discharge: 2021-11-25 | Discharge status: Against Medical Advice | Payer: Self-pay | Attending: Emergency Medicine | Admitting: Emergency Medicine

## 2021-11-25 DIAGNOSIS — R0681 Apnea, not elsewhere classified: Secondary | ICD-10-CM | POA: Insufficient documentation

## 2021-11-25 DIAGNOSIS — R0689 Other abnormalities of breathing: Secondary | ICD-10-CM | POA: Insufficient documentation

## 2021-11-25 DIAGNOSIS — R410 Disorientation, unspecified: Secondary | ICD-10-CM | POA: Insufficient documentation

## 2021-11-25 DIAGNOSIS — R519 Headache, unspecified: Secondary | ICD-10-CM | POA: Insufficient documentation

## 2021-11-25 DIAGNOSIS — I10 Essential (primary) hypertension: Secondary | ICD-10-CM | POA: Insufficient documentation

## 2021-11-25 DIAGNOSIS — R079 Chest pain, unspecified: Secondary | ICD-10-CM | POA: Insufficient documentation

## 2021-11-25 LAB — COMPREHENSIVE METABOLIC PANEL WITH GFR
ALT: 21 U/L (ref 0–44)
AST: 26 U/L (ref 15–41)
Albumin: 3.9 g/dL (ref 3.5–5.0)
Alkaline Phosphatase: 46 U/L (ref 38–126)
Anion gap: 13 (ref 5–15)
BUN: 10 mg/dL (ref 6–20)
CO2: 24 mmol/L (ref 22–32)
Calcium: 9.2 mg/dL (ref 8.9–10.3)
Chloride: 104 mmol/L (ref 98–111)
Creatinine, Ser: 1.14 mg/dL (ref 0.61–1.24)
GFR, Estimated: >60 mL/min
Glucose, Bld: 71 mg/dL (ref 70–99)
Potassium: 3.7 mmol/L (ref 3.5–5.1)
Sodium: 141 mmol/L (ref 135–145)
Total Bilirubin: 0.8 mg/dL (ref 0.3–1.2)
Total Protein: 6.4 g/dL — ABNORMAL LOW (ref 6.5–8.1)

## 2021-11-25 LAB — I-STAT VENOUS BLOOD GAS, ED
Acid-Base Excess: 2 mmol/L (ref 0.0–2.0)
Bicarbonate: 26.4 mmol/L (ref 20.0–28.0)
Calcium, Ion: 1.08 mmol/L — ABNORMAL LOW (ref 1.15–1.40)
HCT: 37 % — ABNORMAL LOW (ref 39.0–52.0)
Hemoglobin: 12.6 g/dL — ABNORMAL LOW (ref 13.0–17.0)
O2 Saturation: 99 %
Potassium: 3.7 mmol/L (ref 3.5–5.1)
Sodium: 139 mmol/L (ref 135–145)
TCO2: 28 mmol/L (ref 22–32)
pCO2, Ven: 40.4 mmHg — ABNORMAL LOW (ref 44–60)
pH, Ven: 7.423 (ref 7.25–7.43)
pO2, Ven: 147 mmHg — ABNORMAL HIGH (ref 32–45)

## 2021-11-25 LAB — CBC WITH DIFFERENTIAL/PLATELET
Abs Immature Granulocytes: 0.01 10*3/uL (ref 0.00–0.07)
Basophils Absolute: 0 10*3/uL (ref 0.0–0.1)
Basophils Relative: 0 %
Eosinophils Absolute: 0.1 10*3/uL (ref 0.0–0.5)
Eosinophils Relative: 2 %
HCT: 37.2 % — ABNORMAL LOW (ref 39.0–52.0)
Hemoglobin: 12.9 g/dL — ABNORMAL LOW (ref 13.0–17.0)
Immature Granulocytes: 0 %
Lymphocytes Relative: 28 %
Lymphs Abs: 2 10*3/uL (ref 0.7–4.0)
MCH: 30.7 pg (ref 26.0–34.0)
MCHC: 34.7 g/dL (ref 30.0–36.0)
MCV: 88.6 fL (ref 80.0–100.0)
Monocytes Absolute: 0.5 10*3/uL (ref 0.1–1.0)
Monocytes Relative: 7 %
Neutro Abs: 4.4 10*3/uL (ref 1.7–7.7)
Neutrophils Relative %: 63 %
Platelets: 121 10*3/uL — ABNORMAL LOW (ref 150–400)
RBC: 4.2 MIL/uL — ABNORMAL LOW (ref 4.22–5.81)
RDW: 13.1 % (ref 11.5–15.5)
WBC: 7 10*3/uL (ref 4.0–10.5)
nRBC: 0 % (ref 0.0–0.2)

## 2021-11-25 LAB — URINALYSIS, ROUTINE W REFLEX MICROSCOPIC
Bilirubin Urine: NEGATIVE
Glucose, UA: NEGATIVE mg/dL
Hgb urine dipstick: NEGATIVE
Ketones, ur: NEGATIVE mg/dL
Leukocytes,Ua: NEGATIVE
Nitrite: NEGATIVE
Protein, ur: NEGATIVE mg/dL
Specific Gravity, Urine: 1.018 (ref 1.005–1.030)
pH: 5 (ref 5.0–8.0)

## 2021-11-25 LAB — CBG MONITORING, ED: Glucose-Capillary: 125 mg/dL — ABNORMAL HIGH (ref 70–99)

## 2021-11-25 LAB — AMMONIA: Ammonia: 26 umol/L (ref 9–35)

## 2021-11-25 LAB — ETHANOL: Alcohol, Ethyl (B): <10 mg/dL

## 2021-11-25 LAB — TROPONIN I (HIGH SENSITIVITY): Troponin I (High Sensitivity): 4 ng/L (ref <18)
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
## 2021-11-25 NOTE — ED Notes (Signed)
Pt yelling, crying, stating "I want to go!"; PA Harris at bedside

## 2021-11-25 NOTE — ED Notes (Signed)
Pt found standing in room. Getting dressed. Refusing to stay and see PA. Aox4. Signs AMA- warned of possible risks of leaving. Followed to lobby and staff notified of this patient.

## 2021-11-25 NOTE — Progress Notes (Signed)
Pt placed on BiPAP per MD. RT placed pt on settings of 8/5 40%. Pt not tolerating BiPAP at this time due to hyperventilation, high min ventilation, pt crying and yelling. RT placed pt on 2L [place], pt tolerating well at this time, MD aware, RN at bedside, RT will monitor.
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
